# Patient Record
Sex: Female | Born: 1980 | Race: White | Hispanic: Yes | Marital: Married | Smoking: Never smoker
Health system: Southern US, Community
[De-identification: ages and names within clinical notes are randomized; demographics above are authoritative.]

## PROBLEM LIST (undated history)

## (undated) DIAGNOSIS — I10 Essential (primary) hypertension: Secondary | ICD-10-CM

## (undated) DIAGNOSIS — O0930 Supervision of pregnancy with insufficient antenatal care, unspecified trimester: Secondary | ICD-10-CM

## (undated) HISTORY — PX: LAPAROSCOPIC CHOLECYSTECTOMY: SUR755

## (undated) HISTORY — PX: ABDOMINAL SURGERY: SHX537

## (undated) HISTORY — DX: Essential (primary) hypertension: I10

---

## 1999-01-23 ENCOUNTER — Other Ambulatory Visit: Admission: RE | Admit: 1999-01-23 | Discharge: 1999-01-23 | Payer: Self-pay | Admitting: Obstetrics and Gynecology

## 1999-02-01 ENCOUNTER — Ambulatory Visit (HOSPITAL_COMMUNITY): Admission: RE | Admit: 1999-02-01 | Discharge: 1999-02-01 | Payer: Self-pay | Admitting: Obstetrics and Gynecology

## 1999-02-01 ENCOUNTER — Encounter: Payer: Self-pay | Admitting: Obstetrics and Gynecology

## 1999-02-26 ENCOUNTER — Ambulatory Visit (HOSPITAL_COMMUNITY): Admission: RE | Admit: 1999-02-26 | Discharge: 1999-02-26 | Payer: Self-pay | Admitting: *Deleted

## 1999-02-26 ENCOUNTER — Encounter: Payer: Self-pay | Admitting: *Deleted

## 1999-05-04 ENCOUNTER — Encounter (INDEPENDENT_AMBULATORY_CARE_PROVIDER_SITE_OTHER): Payer: Self-pay

## 1999-05-04 ENCOUNTER — Inpatient Hospital Stay (HOSPITAL_COMMUNITY): Admission: AD | Admit: 1999-05-04 | Discharge: 1999-05-07 | Payer: Self-pay | Admitting: *Deleted

## 2002-01-12 ENCOUNTER — Ambulatory Visit (HOSPITAL_COMMUNITY): Admission: RE | Admit: 2002-01-12 | Discharge: 2002-01-12 | Payer: Self-pay | Admitting: *Deleted

## 2002-03-23 ENCOUNTER — Observation Stay (HOSPITAL_COMMUNITY): Admission: AD | Admit: 2002-03-23 | Discharge: 2002-03-24 | Payer: Self-pay | Admitting: Obstetrics and Gynecology

## 2002-03-26 ENCOUNTER — Inpatient Hospital Stay (HOSPITAL_COMMUNITY): Admission: AD | Admit: 2002-03-26 | Discharge: 2002-03-28 | Payer: Self-pay | Admitting: *Deleted

## 2002-03-31 ENCOUNTER — Inpatient Hospital Stay (HOSPITAL_COMMUNITY): Admission: AD | Admit: 2002-03-31 | Discharge: 2002-03-31 | Payer: Self-pay | Admitting: *Deleted

## 2002-05-31 ENCOUNTER — Inpatient Hospital Stay (HOSPITAL_COMMUNITY): Admission: AD | Admit: 2002-05-31 | Discharge: 2002-06-02 | Payer: Self-pay | Admitting: *Deleted

## 2007-11-29 ENCOUNTER — Ambulatory Visit (HOSPITAL_COMMUNITY): Admission: RE | Admit: 2007-11-29 | Discharge: 2007-11-29 | Payer: Self-pay | Admitting: Obstetrics & Gynecology

## 2008-03-21 ENCOUNTER — Encounter: Payer: Self-pay | Admitting: Obstetrics & Gynecology

## 2008-03-21 ENCOUNTER — Ambulatory Visit: Payer: Self-pay | Admitting: Obstetrics and Gynecology

## 2008-03-21 ENCOUNTER — Inpatient Hospital Stay (HOSPITAL_COMMUNITY): Admission: AD | Admit: 2008-03-21 | Discharge: 2008-03-23 | Payer: Self-pay | Admitting: Obstetrics & Gynecology

## 2008-03-21 ENCOUNTER — Ambulatory Visit (HOSPITAL_COMMUNITY): Admission: RE | Admit: 2008-03-21 | Discharge: 2008-03-21 | Payer: Self-pay | Admitting: Obstetrics & Gynecology

## 2010-01-13 ENCOUNTER — Observation Stay (HOSPITAL_COMMUNITY): Admission: EM | Admit: 2010-01-13 | Discharge: 2010-01-14 | Payer: Self-pay | Admitting: Emergency Medicine

## 2010-01-13 ENCOUNTER — Encounter (INDEPENDENT_AMBULATORY_CARE_PROVIDER_SITE_OTHER): Payer: Self-pay

## 2010-12-03 LAB — URINALYSIS, ROUTINE W REFLEX MICROSCOPIC
Bilirubin Urine: NEGATIVE
Nitrite: NEGATIVE
Specific Gravity, Urine: 1.015 (ref 1.005–1.030)
pH: 8 (ref 5.0–8.0)

## 2010-12-03 LAB — DIFFERENTIAL
Basophils Absolute: 0 10*3/uL (ref 0.0–0.1)
Basophils Relative: 0 % (ref 0–1)
Eosinophils Absolute: 0.6 10*3/uL (ref 0.0–0.7)
Neutro Abs: 7.5 10*3/uL (ref 1.7–7.7)
Neutrophils Relative %: 64 % (ref 43–77)

## 2010-12-03 LAB — COMPREHENSIVE METABOLIC PANEL
ALT: 67 U/L — ABNORMAL HIGH (ref 0–35)
Alkaline Phosphatase: 98 U/L (ref 39–117)
BUN: 11 mg/dL (ref 6–23)
CO2: 27 mEq/L (ref 19–32)
Chloride: 104 mEq/L (ref 96–112)
Glucose, Bld: 112 mg/dL — ABNORMAL HIGH (ref 70–99)
Potassium: 4.3 mEq/L (ref 3.5–5.1)
Total Bilirubin: 0.3 mg/dL (ref 0.3–1.2)

## 2010-12-03 LAB — URINE MICROSCOPIC-ADD ON

## 2010-12-03 LAB — URINE CULTURE

## 2010-12-03 LAB — POCT PREGNANCY, URINE: Preg Test, Ur: NEGATIVE

## 2010-12-03 LAB — CBC
HCT: 39.9 % (ref 36.0–46.0)
Hemoglobin: 14.1 g/dL (ref 12.0–15.0)
RBC: 4.63 MIL/uL (ref 3.87–5.11)
WBC: 11.6 10*3/uL — ABNORMAL HIGH (ref 4.0–10.5)

## 2011-01-31 NOTE — Discharge Summary (Signed)
[facility]  Patient:    Taylor Lopez, Taylor Lopez Visit Number: [id number] MRN: [id number]          Service Type: OBS Location: [id number] Attending Physician:  Michaelle Copas Dictated by:   Dr. Francis Dowse Admit Date:  03/26/2002 Disc. Date: 03/24/02                             Discharge Summary  ADMISSION DIAGNOSES:          Preterm labor.  DISCHARGE DIAGNOSIS:          Resolution of preterm labor.  PROCEDURE:                    None.  HISTORY OF PRESENT ILLNESS:   This is a 30 year old, gravida 2, para 1-0-0-1, who presented at 28 weeks based on LMP in the MAU for contractions.  She had been seen at [facility] earlier on the date of admission at which time cervical changes were noted.  Per the patients report she had been found to be 1 cm dilated at [facility].  She denied any loss of fluid or vaginal bleeding.  She reported good fetal activity and lower abdominal cramping that began at 3 p.m. on the day of admission.  Per report, the patient was poorly hydrated.  Prenatal care began at [facility] at 12-6/7 weeks.  The patients previous pregnancy was associated with PIH, and induction of labor for PROM.  PRENATAL LABORATORY DATA:     All negative.  PHYSICAL EXAMINATION:         VITAL SIGNS: Afebrile with blood pressure of 132/69, pulse 99.  GENERAL: She was alert and in no acute distress.  PELVIC: Cervix fingertip, 60% effaced, and at -2 station.  Fetal heart rate tracing showed a baseline heart rate of 140s to 150s with good variability, reactivity, and some mild variable decellerations.  Tokometer showed uterine irritability.  A wet prep was done which showed many white blood cells and bacteria.  GC and Chlamydia and GBS tests were also sent.  HOSPITAL COURSE:              The patient was admitted for observation and started on Unasyn, fluids, and betamethasone and placed on bed rest.  During her hospital stay, the patient  remained stable.  Her contractions subsided ending at approximately 7 a.m. on the morning of discharge.  She denied any loss of fluid or vaginal bleeding and reported good fetal movement.  She remained afebrile with good blood pressures and fetal heart rate tracing showed heart rates in the 130s to 140s, good reactivity, good variability with occasional mild variable decellerations.  The tokometer showed no contractions or uterine irritability.  A cervical check showed no change in examination from admission with the patient at approximately 1 cm dilated, 60% effaced, -2 station with a soft cervix.  Because the patients contractions stopped, there was no loss of fluid and no cervical change since admission.  She was discharged to home with preterm labor instructions which were explained to her through an interpretor since the patient speaks Spanish only.  At the time of discharge, the patient had received one dose of betamethasone and the results of her GC, Chlamydia, and GBS tests were still pending.  The patient is to follow up in two to three days at the maternity admissions unit for a cervical check.  She was also encouraged  to keep her scheduled appointment at [facility].  CONDITION ON DISCHARGE:       Stable.  DISPOSITION:                  Discharged to home on bed rest.  DISCHARGE MEDICATIONS:        None.  DISCHARGE INSTRUCTIONS:       Regular diet, bed rest until reexamined by physician.  She was also instructed not to have sex until she was seen again by a physician.  She was to follow up at maternity admissions unit at [facility] [place] in two to three days for repeat cervical check. Dictated by:   Dr. Francis Dowse Attending Physician:  Michaelle Copas DD:  03/24/02 TD:  03/28/02 Job: [phone] [id number]

## 2011-01-31 NOTE — Discharge Summary (Signed)
[facility]  Patient:    Taylor Lopez, Taylor Lopez Visit Number: [id number] MRN: [id number]          Service Type: OBS Location: [facility] Attending Physician:  Enid Cutter Dictated by:    Dahlia Byes, M.D. Admit Date:  03/31/2002 Disc. Date: 03/28/02                             Discharge Summary  ADMISSION DIAGNOSIS:  Preterm contractions.  DISCHARGE DIAGNOSIS:  Preterm contractions without cervical change.  PROCEDURES:  None.  HISTORY OF PRESENT ILLNESS:  This is a 30 year old, G2, P1-0-0-1, who presented to the maternity admissions unit at 28-3/7 weeks by last menstrual period with contractions.  She reported approximately three contractions a hour.  No loss of fluid or vaginal bleeding, and good fetal activity.  The patient had been hospitalized at [facility] earlier in the week for preterm cervical changes, and had received Unasyn antibiotics and one dose of betamethasone.  She was discharged once her contractions had ceased, and had been asked to follow up at MAU on 03/26/02.  Since her discharge, she had not been bothered by contractions, but they returned on the night prior to this admission at approximately three contractions a hour.  ADMISSION MEDICATIONS: 1. Tylenol p.r.n. 2. Prenatal vitamins q.d.  PAST MEDICAL HISTORY:  History of pregnancy induced hypertension with her first pregnancy, which resulted in the delivery of a full-term female at 65 weeks, after induction of labor for premature rupture of membranes and pregnancy induced hypertension.  PHYSICAL EXAMINATION:  VITAL SIGNS:  Stable with a temperature of 99.1, blood pressure 138/64, pulse 86, respiratory rate 18.  GENERAL:  She was alert, in no acute distress with no right upper quadrant tenderness, no edema, and 1+ deep tendon reflexes.  PELVIC:  On speculum exam, some slight mucoid discharge was noted and a fetal fibronectin was obtained.  Cervical examination found the  cervix to be 1 cm dilated, 50% effaced, soft, and station of -2.  External fetal monitoring showed a baseline of 130s to 140s, good variability and reactivity, and a few mild variable decelerations.  Contractions were at a rate of every 3 to 5 minutes.  A wet prep was done which showed no yeast, Trichomonas, or clue cells, but was positive for rare white blood cells and moderate bacteria.  HOSPITAL COURSE:  The patient was given 0.25 mg of terbutaline subcutaneously x1, which decreased her contraction frequency.  She was admitted to the antenatal unit, placed on bedrest, continued with external fetal monitoring and tocometry.  She was given a 1 L IV fluid bolus.  The patient was given a second dose of subcutaneous terbutaline 0.25 mg, and then was started on 10 mg of Procardia p.o. q.6h. to help with contractions. By hospital day #2, her contractions had decreased to approximately 1 to 3 contractions a hour, and frequent uterine irritability.  She denied any vaginal bleeding or discharge, or loss of fluid, and had good fetal movement. A cervical examination showed the external os at 1 cm, the internal os closed, 60% effacement, and -2 station with a posterior cervix.  Fetal heart tracings showed heart rates in the 140s with good variability and no decelerations. The fetal fibronectin obtained on admission was reported as negative, and labs from 03/23/02, showed the patient to be GBS negative and GC and chlamydia negative.  The patient was continued on p.o. Procardia, bedrest, and Unasyn treatment.  On hospital day #3, the patient again reported no loss of fluid and good fetal movement.  Tocometry showed approximately one contraction per hour, and the external fetal monitoring showed heart tones in the 140s to 150s with good variability and accelerations without any decelerations.  Because the patients contractions were well controlled on p.o. Procardia, and because there were no cervical  changes since admission, she was deemed stable and discharged home for followup as an outpatient.  CONDITION ON DISCHARGE:  Stable.  DISPOSITION:  Discharged to home.  DISCHARGE MEDICATIONS:  Procardia 10 mg p.o. q.6h.  DISCHARGE INSTRUCTIONS: 1. Bedrest except for bathroom and eating privileges. 2. Nothing per vagina. 3. No sexual intercourse.  DIET:  Regular.  FOLLOWUP:  She is to return to the Maternity Admissions Unit at [facility] on Thursday, 03/31/02 for followup, and is to keep her scheduled appointment at [facility] on Wednesday, 04/06/02. Dictated byDahlia Byes, M.D. Attending Physician:  Enid Cutter DD:  03/28/02 TD:  03/31/02 Job: [id number]

## 2011-06-12 LAB — RPR: RPR Ser Ql: NONREACTIVE

## 2011-06-12 LAB — CBC
MCV: 87
Platelets: 209
RDW: 15.2
WBC: 8

## 2012-03-31 ENCOUNTER — Other Ambulatory Visit: Payer: Self-pay | Admitting: Family Medicine

## 2012-03-31 DIAGNOSIS — N644 Mastodynia: Secondary | ICD-10-CM

## 2012-04-29 ENCOUNTER — Ambulatory Visit
Admission: RE | Admit: 2012-04-29 | Discharge: 2012-04-29 | Disposition: A | Discharge status: Home / Self Care | Payer: Self-pay | Source: Ambulatory Visit | Attending: Family Medicine | Admitting: Family Medicine

## 2012-04-29 DIAGNOSIS — N644 Mastodynia: Secondary | ICD-10-CM

## 2012-04-29 NOTE — Progress Notes (Signed)
Interpreter Wyvonnia Dusky for mammogram

## 2014-12-17 ENCOUNTER — Emergency Department (HOSPITAL_COMMUNITY): Payer: No Typology Code available for payment source

## 2014-12-17 ENCOUNTER — Encounter (HOSPITAL_COMMUNITY): Payer: Self-pay | Admitting: Vascular Surgery

## 2014-12-17 ENCOUNTER — Emergency Department (HOSPITAL_COMMUNITY)
Admission: EM | Admit: 2014-12-17 | Discharge: 2014-12-17 | Disposition: A | Discharge status: Home / Self Care | Payer: No Typology Code available for payment source | Attending: Emergency Medicine | Admitting: Emergency Medicine

## 2014-12-17 DIAGNOSIS — S199XXA Unspecified injury of neck, initial encounter: Secondary | ICD-10-CM | POA: Diagnosis not present

## 2014-12-17 DIAGNOSIS — Y92410 Unspecified street and highway as the place of occurrence of the external cause: Secondary | ICD-10-CM | POA: Insufficient documentation

## 2014-12-17 DIAGNOSIS — S8991XA Unspecified injury of right lower leg, initial encounter: Secondary | ICD-10-CM | POA: Diagnosis present

## 2014-12-17 DIAGNOSIS — S8001XA Contusion of right knee, initial encounter: Secondary | ICD-10-CM | POA: Insufficient documentation

## 2014-12-17 DIAGNOSIS — Y998 Other external cause status: Secondary | ICD-10-CM | POA: Diagnosis not present

## 2014-12-17 DIAGNOSIS — M25561 Pain in right knee: Secondary | ICD-10-CM

## 2014-12-17 DIAGNOSIS — Y9389 Activity, other specified: Secondary | ICD-10-CM | POA: Insufficient documentation

## 2014-12-17 DIAGNOSIS — S4991XA Unspecified injury of right shoulder and upper arm, initial encounter: Secondary | ICD-10-CM | POA: Insufficient documentation

## 2014-12-17 MED ORDER — MORPHINE SULFATE 4 MG/ML IJ SOLN
4.0000 mg | Freq: Once | INTRAMUSCULAR | Status: AC
  Administered 2014-12-17: 4 mg via INTRAVENOUS
  Filled 2014-12-17: qty 1

## 2014-12-17 MED ORDER — IBUPROFEN 600 MG PO TABS: 600.0000 mg | ORAL_TABLET | Freq: Four times a day (QID) | ORAL | Status: DC | PRN

## 2014-12-17 MED ORDER — METHOCARBAMOL 500 MG PO TABS: 500.0000 mg | ORAL_TABLET | Freq: Two times a day (BID) | ORAL | Status: DC

## 2014-12-17 MED ORDER — OXYCODONE-ACETAMINOPHEN 5-325 MG PO TABS: 2.0000 | ORAL_TABLET | ORAL | Status: DC | PRN
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
## 2014-12-17 NOTE — ED Notes (Signed)
Patient returned from CT
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
## 2014-12-17 NOTE — Discharge Instructions (Signed)
Colisin con un vehculo de motor Academic librarian) Despus de sufrir un accidente automovilstico, es normal tener diversos hematomas y [employer]. Generalmente, estas molestias son peores durante las primeras 24 horas. En las primeras horas, probablemente sienta mayor entumecimiento y Engineer, mining. Tambin puede sentirse peor al despertarse la maana posterior a la colisin. A partir de all, debera comenzar a Associate Professor. La velocidad con que se mejora generalmente depende de la gravedad de la colisin y la cantidad, [place] y Firefighter de las lesiones. INSTRUCCIONES PARA EL CUIDADO EN EL HOGAR   Aplique hielo sobre la zona lesionada.  Ponga el hielo en una bolsa plstica.  Colquese una toalla entre la piel y la bolsa de hielo.  Deje el hielo durante 15 a , 3 a 4veces por da, o segn las indicaciones del mdico.  Albesa Seen suficiente lquido para mantener la orina clara o de color amarillo plido. No beba alcohol.  Tome una ducha o un bao tibio una o dos veces al da. Esto aumentar el flujo de [employer] msculos doloridos.  Puede retomar sus actividades normales cuando se lo indique el mdico. Tenga cuidado al levantar objetos, ya que puede agravar el dolor en el cuello o en la espalda.  Utilice los medicamentos de venta libre o recetados para Primary school teacher, el malestar o la fiebre, segn se lo indique el mdico. No tome aspirina. Puede aumentar los hematomas o la hemorragia. SOLICITE ATENCIN MDICA DE INMEDIATO SI:  Tiene entumecimiento, hormigueo o debilidad en los brazos o las piernas.  Tiene dolor de cabeza intenso que no mejora con medicamentos.  Siente un dolor intenso en el cuello, especialmente con la palpacin en el centro de la espalda o el cuello.  Disminuye su control de la vejiga o los intestinos.  Aumenta el dolor en cualquier parte del cuerpo.  Le falta el aire, tiene sensacin de desvanecimiento, mareos o [employer].  Siente  dolor en el pecho.  Tiene malestar estomacal (nuseas), vmitos o sudoracin.  Cada vez siente ms dolor abdominal.  Anola Gurney sangre en la orina, en la materia fecal o en el vmito.  Siente dolor en los hombros (en la zona del cinturn de seguridad).  Siente que los sntomas empeoran. ASEGRESE DE QUE:   Comprende estas instrucciones.  Controlar su afeccin.  Recibir ayuda de inmediato si no mejora o si empeora. Document Released: 06/11/2005 Document Revised: 01/16/2014 [facility] Patient Information 2015 [place], [place]. This information is not intended to replace advice given to you by your health care provider. Make sure you discuss any questions you have with your health care provider.  Emergency Department Resource Guide 1) Find a Doctor and Pay Out of Pocket Although you won't have to find out who is covered by your insurance plan, it is a good idea to ask around and get recommendations. You will then need to call the office and see if the doctor you have chosen will accept you as a new patient and what types of options they offer for patients who are self-pay. Some doctors offer discounts or will set up payment plans for their patients who do not have insurance, but you will need to ask so you aren't surprised when you get to your appointment.  2) Contact Your Local Health Department Not all health departments have doctors that can see patients for sick visits, but many do, so it is worth a call to see if yours does. If you don't know where your local health department is,  is, you can check in your phone book. The CDC also has a tool to help you locate your state's health department, and many state websites also have listings of all of their local health departments. ° °3) Find a Walk-in Clinic °If your illness is not likely to be very severe or complicated, you may want to try a walk in clinic. These are popping up all over the country in pharmacies, drugstores, and shopping centers.  They're usually staffed by nurse practitioners or physician assistants that have been trained to treat common illnesses and complaints. They're usually fairly quick and inexpensive. However, if you have serious medical issues or chronic medical problems, these are probably not your best option. ° °No Primary Care Doctor: °- Call Health Connect at  832-8000 - they can help you locate a primary care doctor that  accepts your insurance, provides certain services, etc. °- Physician Referral Service- 1-800-533-3463 ° °Chronic Pain Problems: °Organization         Address  Phone   Notes  °[facility]  (336) 297-2271 Patients need to be referred by their primary care doctor.  ° °Medication Assistance: °Organization         Address  Phone   Notes  °[place] [facility] [address] °[place], [place] [address] (336) 641-8030 --Must be a resident of [place] °-- Must have NO insurance coverage whatsoever (no Medicaid/ Medicare, etc.) °-- The pt. MUST have a primary care doctor that directs their care regularly and follows them in the community °  °[facility]  (866) 331-1348   °United Way  (888) 892-1162   ° °Agencies that provide inexpensive medical care: °Organization         Address  Phone   Notes  °[facility]  (336) 832-8035   °[facility]    (336) 832-7272   °[facility] [address] °[place], [place] [address] (336) 832-4777   °[facility] [address], °[place] (336) 271-4999   °[facility]    (336) 373-0678   °[facility]    (336) 272-1050   °[facility] ° [address], [place] Phone:  (336) 832-4444, Fax:  (336) 832-4440 Hours of Operation:  9 am - 6 pm, M-F.  Also accepts Medicaid/Medicare and self-pay.  ° [facility] ° [address], [place] Phone: (336) 832-3150, Fax: (336)  832-3151. Hours of Operation:  8:30 am - 5:30 pm, M-F.  Also accepts Medicaid and self-pay.  °[facility] [place] [address], [place] Phone: (336) 878-6027   °[facility] [address], [place],  (336)723-1848, Ext. 123 Mondays & Thursdays: 7-9 AM.  First 15 patients are seen on a first come, first serve basis. °  ° °Medicaid-accepting [place] Providers: ° °Organization         Address  Phone   Notes  °[facility] [address], [place] (336) 641-2100 Also accepts self-pay patients.  °[facility] [address], [place] ° (336) 856-9996   °[facility] [address], [place] (336) 288-8857   °[facility] [address] [place] [address], [place] (336) 299-7000   °Veita Bland [address], [place]  ° (336) 373-1557 Only accepts [place] Access Medicaid patients after they have their   name applied to their card.  ° °Self-Pay (no insurance) in [place]: ° °Organization         Address  Phone   Notes  °Sickle Cell Patients, [facility] [address], [place] (336) 832-1970   °[facility] [address], [place] (336) 832-4400   °[facility] ° [address], [place] (336) 992-4800   °[facility]/Dr. Osei-Bonsu ° [address] [place] [address], [place] or [address], [place] (336) 841-8500 Phone number for both [place] and [place] locations is the same.  °[facility] [address], [place] (336) 299-0000   °[facility] [address] [place] [address], [place] or [address] (336) 852-7530 °(336) 878-2260   °[facility] [address], [place] (336) 350-1642, phone; (336) 294-5005, fax Sees patients 1st and 3rd Saturday of every month.  Must not qualify for public or private insurance (i.e.  Medicaid, Medicare, [place] Health Choice, Veterans' Benefits) • Household income should be no more than 200% of the poverty level •The clinic cannot treat you if you are pregnant or think you are pregnant • Sexually transmitted diseases are not treated at the clinic.  ° ° °Dental Care: °Organization         Address  Phone  Notes  °[facility] [facility] [address], [place] (336) 641-6152 Accepts children up to age 21 who are enrolled in Medicaid or [place] Health Choice; pregnant women with a Medicaid card; and children who have applied for Medicaid or [place] Health Choice, but were declined, whose parents can pay a reduced fee at time of service.  °[facility] [place]  [address], [place] (336) 641-7733 Accepts children up to age 21 who are enrolled in Medicaid or [place] Health Choice; pregnant women with a Medicaid card; and children who have applied for Medicaid or [place] Health Choice, but were declined, whose parents can pay a reduced fee at time of service.  °[facility] ° [address], [place] (336) 641-4533 Patients are seen by appointment only. Walk-ins are not accepted. [facility] will see patients 18 years of age and older. °Monday - Tuesday (8am-5pm) °Most Wednesdays (8:30-5pm) °$30 per visit, cash only  °[facility] ° [address], [place] (336) 641-4533 Patients are seen by appointment only. Walk-ins are not accepted. [facility] will see patients 18 years of age and older. °One Wednesday Evening (Monthly: Volunteer Based).  $30 per visit, cash only  °[facility]  (919) 537-3737 for adults; Children under age 4, call Graduate Pediatric Dentistry at (919) [phone]. Children aged 4-14, please call (919) [phone] to request a pediatric application. ° Dental services are provided in all areas of dental care including fillings,  crowns and bridges, complete and partial dentures, implants, gum treatment, root canals, and extractions. Preventive care is also provided. Treatment is provided to both adults and children. °Patients are selected via a lottery and there is often a waiting list. °  °[facility] [address], °[place] ° (336) 763-8833 [email or web address] °  °[facility] [address], [place], [place] (336)723-1848, Ext. 123 Second and Fourth Thursday of each month, opens at 6:30 AM; Clinic ends at 9 AM.  Patients are seen on a   first-come first-served basis, and a limited number are seen during each clinic.  ° °[facility] ° [address], [place], [place] (336) 723-7904   Eligibility Requirements °You must have lived in [place], [place], or [place] counties for at least the last three months. °  You cannot be eligible for state or federal sponsored healthcare insurance, including Veterans Administration, Medicaid, or Medicare. °  You generally cannot be eligible for healthcare insurance through your employer.  °  How to apply: °Eligibility screenings are held every Tuesday and Wednesday afternoon from 1:00 pm until 4:00 pm. You do not need an appointment for the interview!  °[facility] [address], [place], [place] 336-631-2330   °[facility]  336-342-8273   °[facility]  336-703-3100   ° [facility]  336-570-6415   ° °Behavioral Health Resources in the Community: °Intensive Outpatient Programs °Organization         Address  Phone  Notes  °[facility] [address], [place], [place] 336-878-6098   °[facility] [address], [place], [place] 336-832-9800   °[facility] [address], [place], [place] ° 336-882-2125   °[facility] [address],  °[place], [place] 1-800-853-5163 or 336-641-4981   °Substance Abuse  Resources °Organization         Address  Phone  Notes  °[facility]  336-882-2125   °[facility]  336-784-9470   °[facility]  336-285-9073   °[facility]  336-845-3988   °Residential & Outpatient Substance Abuse Program  1-800-659-3381   °Psychological Services °Organization         Address  Phone  Notes  °[facility]  336- 832-9600   °[facility]  336- 378-7881   °[facility] [address], [place] 1-800-853-5163 or 336-641-4981   ° °Mobile Crisis Teams °Organization         Address  Phone  Notes  °[facility], Mobile Crisis Care Unit  1-877-626-1772   °Assertive °[facility] ° [address] [place], [place] 336-834-9664   °Sharon DeEsch [address] °[place] 336-554-5454   ° °Self-Help/Support Groups °Organization         Address  Phone             Notes  °[facility] - variety of support groups  336- 373-1402 Call for more information  °Narcotics Anonymous (NA), [facility] [address], °[place]  2 meetings at this location  ° °Residential Treatment Programs °Organization         Address  Phone  Notes  °[facility] [address],    °[place]  1-866-801-8205   °[facility] ° [address], [place], [place] 704-293-8524   °[facility] [address], [place] 336-845-3988 Admissions: 8am-3pm M-F  °[facility] [address]    °[place], [place] 336-841-1104   °[facility] [address], [place], [place] 336-379-7146   °[facility] [address]  °[place], [place] 336-285-9073   °[facility] Intensive Outpatient [address], [place], [place] 336-852-3033   °[facility] [address]  °[place],  1-877-615-2722 or 336-784-9470   °[facility] [address] [place],  336-227-7417   Accepts Medicaid  °[facility] [address]  °[place] 1-800-659-3381 Substance Abuse/Addiction Treatment  ° °[place] Behavioral Health Resources °Organization         Address  Phone  Notes  °[facility]  (888) 581-9988   °Julie Brannon, PhD [address] [place], [place]   (336) 349-5553 or (336) 951-0000   °[facility]   [address] °[place], [place] (336) 349-4454   °[facility] [address], [place], [place] (336) 342-8316 Insurance/Medicaid/sponsorship through [facility]  °[facility] [address]                                    [place], [place] (336) 342-8316 Therapy/tele-psych/case  °[facility] [address]  ° [place], [place] (336) 349-2233    °Dr. Arfeen  (336) 349-4544   °[facility] [place]  [employer] [place] [facility] 1) [address], [place] °2) [address], [place] °3)  [address], [place] (336) 349-3220 °(336) 342-7768 ° °(336) 342-8140   °[place] [facility] (336) 342-1394 or (336) 342-3537 (After Hours)    ° ° ° °

## 2014-12-17 NOTE — ED Notes (Addendum)
Patient transported to XR. 

## 2016-07-24 LAB — OB RESULTS CONSOLE HEPATITIS B SURFACE ANTIGEN: HEP B S AG: NEGATIVE

## 2016-07-24 LAB — OB RESULTS CONSOLE ANTIBODY SCREEN: ANTIBODY SCREEN: NEGATIVE

## 2016-07-24 LAB — OB RESULTS CONSOLE GC/CHLAMYDIA
Chlamydia: NEGATIVE
Gonorrhea: NEGATIVE

## 2016-07-24 LAB — OB RESULTS CONSOLE HIV ANTIBODY (ROUTINE TESTING): HIV: NONREACTIVE

## 2016-07-24 LAB — OB RESULTS CONSOLE RPR: RPR: NONREACTIVE

## 2016-07-24 LAB — OB RESULTS CONSOLE RUBELLA ANTIBODY, IGM: RUBELLA: IMMUNE

## 2016-09-15 NOTE — L&D Delivery Note (Signed)
Delivery Note At 7:17 AM a viable female was delivered via Vaginal, Spontaneous Delivery (Presentation: ROA).  APGAR: 9, 9; weight pending  .   Placenta status: Placenta delivered intact with gentle traction.  Cord: # vessels with the following complications: None. Cord pH: not collected  Anesthesia: No epidural  Episiotomy: None Lacerations: 1st degree Suture Repair: N/A Est. Blood Loss (mL): 150  Mom to postpartum.  Baby to Couplet care / Skin to Skin.  Taylor Lopez, PGY-1 01/14/2017, 8:08 AM  Patient is a [id number] at [redacted]w[redacted]d who was admitted w/ SROM/labor, uncomplicated prenatal course.  She progressed without augmentation.  I was gloved and present for delivery in its entirety.  Second stage of labor progressed, baby delivered after a couple contractions.  Mild early decels during second stage noted.  Complications: none  Lacerations: superficial 1st degree- hemostatic, not repaired  EBL: 150cc  Taylor Lopez, CNM 9:05 AM 01/14/2017

## 2017-01-05 LAB — OB RESULTS CONSOLE GBS: STREP GROUP B AG: NEGATIVE

## 2017-01-14 ENCOUNTER — Inpatient Hospital Stay (HOSPITAL_COMMUNITY)
Admission: AD | Admit: 2017-01-14 | Discharge: 2017-01-15 | DRG: 775 | Disposition: A | Discharge status: Home / Self Care | Payer: Medicaid Other | Source: Ambulatory Visit | Attending: Obstetrics and Gynecology | Admitting: Obstetrics and Gynecology

## 2017-01-14 ENCOUNTER — Encounter: Payer: Self-pay | Admitting: Student

## 2017-01-14 DIAGNOSIS — Z3493 Encounter for supervision of normal pregnancy, unspecified, third trimester: Secondary | ICD-10-CM | POA: Diagnosis present

## 2017-01-14 DIAGNOSIS — Z3A38 38 weeks gestation of pregnancy: Secondary | ICD-10-CM

## 2017-01-14 DIAGNOSIS — O4202 Full-term premature rupture of membranes, onset of labor within 24 hours of rupture: Secondary | ICD-10-CM | POA: Diagnosis present

## 2017-01-14 DIAGNOSIS — O4290 Premature rupture of membranes, unspecified as to length of time between rupture and onset of labor, unspecified weeks of gestation: Secondary | ICD-10-CM

## 2017-01-14 LAB — CBC
HCT: 40.4 % (ref 36.0–46.0)
HEMOGLOBIN: 14 g/dL (ref 12.0–15.0)
MCH: 29.5 pg (ref 26.0–34.0)
MCHC: 34.7 g/dL (ref 30.0–36.0)
MCV: 85.2 fL (ref 78.0–100.0)
PLATELETS: 235 10*3/uL (ref 150–400)
RBC: 4.74 MIL/uL (ref 3.87–5.11)
RDW: 15.2 % (ref 11.5–15.5)
WBC: 10.2 10*3/uL (ref 4.0–10.5)

## 2017-01-14 LAB — ABO/RH: ABO/RH(D): O POS

## 2017-01-14 LAB — TYPE AND SCREEN
ABO/RH(D): O POS
ANTIBODY SCREEN: NEGATIVE

## 2017-01-14 LAB — RPR: RPR Ser Ql: NONREACTIVE

## 2017-01-14 LAB — POCT FERN TEST: POCT FERN TEST: POSITIVE — AB

## 2017-01-14 MED ORDER — ONDANSETRON HCL 4 MG/2ML IJ SOLN: 4.0000 mg | INTRAMUSCULAR | Status: DC | PRN

## 2017-01-14 MED ORDER — FENTANYL CITRATE (PF) 100 MCG/2ML IJ SOLN: 100.0000 ug | INTRAMUSCULAR | Status: DC | PRN

## 2017-01-14 MED ORDER — LIDOCAINE HCL (PF) 1 % IJ SOLN
30.0000 mL | INTRAMUSCULAR | Status: DC | PRN
  Filled 2017-01-14: qty 30

## 2017-01-14 MED ORDER — FENTANYL 2.5 MCG/ML BUPIVACAINE 1/10 % EPIDURAL INFUSION (WH - ANES): 14.0000 mL/h | INTRAMUSCULAR | Status: DC | PRN

## 2017-01-14 MED ORDER — TETANUS-DIPHTH-ACELL PERTUSSIS 5-2.5-18.5 LF-MCG/0.5 IM SUSP: 0.5000 mL | Freq: Once | INTRAMUSCULAR | Status: DC

## 2017-01-14 MED ORDER — LIDOCAINE HCL (PF) 1 % IJ SOLN
INTRAMUSCULAR | Status: AC
  Filled 2017-01-14: qty 30

## 2017-01-14 MED ORDER — OXYCODONE-ACETAMINOPHEN 5-325 MG PO TABS: 2.0000 | ORAL_TABLET | ORAL | Status: DC | PRN

## 2017-01-14 MED ORDER — ACETAMINOPHEN 325 MG PO TABS
650.0000 mg | ORAL_TABLET | ORAL | Status: DC | PRN
  Administered 2017-01-14: 650 mg via ORAL
  Filled 2017-01-14: qty 2

## 2017-01-14 MED ORDER — SOD CITRATE-CITRIC ACID 500-334 MG/5ML PO SOLN: 30.0000 mL | ORAL | Status: DC | PRN

## 2017-01-14 MED ORDER — OXYTOCIN BOLUS FROM INFUSION
500.0000 mL | Freq: Once | INTRAVENOUS | Status: AC
  Administered 2017-01-14: 500 mL via INTRAVENOUS

## 2017-01-14 MED ORDER — OXYTOCIN 40 UNITS IN LACTATED RINGERS INFUSION - SIMPLE MED
INTRAVENOUS | Status: AC
  Filled 2017-01-14: qty 1000

## 2017-01-14 MED ORDER — ONDANSETRON HCL 4 MG/2ML IJ SOLN: 4.0000 mg | Freq: Four times a day (QID) | INTRAMUSCULAR | Status: DC | PRN

## 2017-01-14 MED ORDER — PHENYLEPHRINE 40 MCG/ML (10ML) SYRINGE FOR IV PUSH (FOR BLOOD PRESSURE SUPPORT)
80.0000 ug | PREFILLED_SYRINGE | INTRAVENOUS | Status: DC | PRN
  Filled 2017-01-14: qty 5

## 2017-01-14 MED ORDER — EPHEDRINE 5 MG/ML INJ
10.0000 mg | INTRAVENOUS | Status: DC | PRN
  Filled 2017-01-14: qty 2

## 2017-01-14 MED ORDER — BENZOCAINE-MENTHOL 20-0.5 % EX AERO
1.0000 "application " | INHALATION_SPRAY | CUTANEOUS | Status: DC | PRN
  Administered 2017-01-14: 1 via TOPICAL
  Filled 2017-01-14: qty 56

## 2017-01-14 MED ORDER — OXYTOCIN 40 UNITS IN LACTATED RINGERS INFUSION - SIMPLE MED: 2.5000 [IU]/h | INTRAVENOUS | Status: DC

## 2017-01-14 MED ORDER — PRENATAL MULTIVITAMIN CH
1.0000 | ORAL_TABLET | Freq: Every day | ORAL | Status: DC
  Administered 2017-01-15: 1 via ORAL
  Filled 2017-01-14: qty 1

## 2017-01-14 MED ORDER — ACETAMINOPHEN 325 MG PO TABS: 650.0000 mg | ORAL_TABLET | ORAL | Status: DC | PRN

## 2017-01-14 MED ORDER — DIPHENHYDRAMINE HCL 50 MG/ML IJ SOLN: 12.5000 mg | INTRAMUSCULAR | Status: DC | PRN

## 2017-01-14 MED ORDER — LACTATED RINGERS IV SOLN: 500.0000 mL | INTRAVENOUS | Status: DC | PRN

## 2017-01-14 MED ORDER — SENNOSIDES-DOCUSATE SODIUM 8.6-50 MG PO TABS
2.0000 | ORAL_TABLET | ORAL | Status: DC
  Administered 2017-01-15: 2 via ORAL
  Filled 2017-01-14: qty 2

## 2017-01-14 MED ORDER — COCONUT OIL OIL: 1.0000 "application " | TOPICAL_OIL | Status: DC | PRN

## 2017-01-14 MED ORDER — ZOLPIDEM TARTRATE 5 MG PO TABS: 5.0000 mg | ORAL_TABLET | Freq: Every evening | ORAL | Status: DC | PRN

## 2017-01-14 MED ORDER — FENTANYL 2.5 MCG/ML BUPIVACAINE 1/10 % EPIDURAL INFUSION (WH - ANES)
INTRAMUSCULAR | Status: DC
Start: 2017-01-14 — End: 2017-01-14
  Filled 2017-01-14: qty 100

## 2017-01-14 MED ORDER — LACTATED RINGERS IV SOLN: 500.0000 mL | Freq: Once | INTRAVENOUS | Status: DC

## 2017-01-14 MED ORDER — OXYCODONE-ACETAMINOPHEN 5-325 MG PO TABS: 1.0000 | ORAL_TABLET | ORAL | Status: DC | PRN

## 2017-01-14 MED ORDER — DIBUCAINE 1 % RE OINT: 1.0000 "application " | TOPICAL_OINTMENT | RECTAL | Status: DC | PRN

## 2017-01-14 MED ORDER — ONDANSETRON HCL 4 MG PO TABS: 4.0000 mg | ORAL_TABLET | ORAL | Status: DC | PRN

## 2017-01-14 MED ORDER — WITCH HAZEL-GLYCERIN EX PADS: 1.0000 "application " | MEDICATED_PAD | CUTANEOUS | Status: DC | PRN

## 2017-01-14 MED ORDER — SIMETHICONE 80 MG PO CHEW: 80.0000 mg | CHEWABLE_TABLET | ORAL | Status: DC | PRN

## 2017-01-14 MED ORDER — PHENYLEPHRINE 40 MCG/ML (10ML) SYRINGE FOR IV PUSH (FOR BLOOD PRESSURE SUPPORT)
PREFILLED_SYRINGE | INTRAVENOUS | Status: AC
  Filled 2017-01-14: qty 20

## 2017-01-14 MED ORDER — LACTATED RINGERS IV SOLN
INTRAVENOUS | Status: DC
  Administered 2017-01-14: 05:00:00 via INTRAVENOUS

## 2017-01-14 MED ORDER — IBUPROFEN 600 MG PO TABS
600.0000 mg | ORAL_TABLET | Freq: Four times a day (QID) | ORAL | Status: DC
  Administered 2017-01-14 – 2017-01-15 (×5): 600 mg via ORAL
  Filled 2017-01-14 (×5): qty 1

## 2017-01-14 MED ORDER — DIPHENHYDRAMINE HCL 25 MG PO CAPS: 25.0000 mg | ORAL_CAPSULE | Freq: Four times a day (QID) | ORAL | Status: DC | PRN

## 2017-01-14 NOTE — MAU Note (Signed)
Patient reports contractions 3-4 mins apart.  Reports good FM.  Noticed some bleeding starting at 2000 after using the restroom.  No HSV/MRSA.

## 2017-01-14 NOTE — Lactation Note (Signed)
This note was copied from a baby's chart. Lactation Consultation Note  Patient Name: Taylor Lopez Date: 01/14/2017 Reason for consult: Initial assessment  Initial visit at 14 hours of age with Spanish interpreter, [place].  Mom reports baby latched at 2115 about 25 minutes ago.  LC observed for swallows and baby released nipple.  Mom re-latched baby in cradle hold and denies pain with latching at breast, but does report uterine cramping. LC provided pillow support and discussed alternate position holds.  Baby was stimulated to continue sucking for a few more minutes, no audible swallows at this time.  When baby released the nipple top half noted to be compressed with blanching of tip.   LC assisted with hand expression for a few minutes and alternated between breasts, No colostrum expressed at this time.  MBU RN at bedside reports unable to express with previous attempts.  LC encouraged mom to continue hand expression attempts.         [employer] LC resources given and discussed.  Encouraged to feed with early cues on demand.  Early newborn behavior discussed.  Mom to call for assist as needed.     Maternal Data Has patient been taught Hand Expression?: Yes Does the patient have breastfeeding experience prior to this delivery?: Yes  Feeding Feeding Type: Breast Fed  LATCH Score/Interventions Latch: Grasps breast easily, tongue down, lips flanged, rhythmical sucking. Intervention(s): Waking techniques  Audible Swallowing: None  Type of Nipple: Everted at rest and after stimulation  Comfort (Breast/Nipple): Soft / non-tender     Hold (Positioning): Assistance needed to correctly position infant at breast and maintain latch. Intervention(s): Breastfeeding basics reviewed;Support Pillows;Position options;Skin to skin  LATCH Score: 7  Lactation Tools Discussed/Used WIC Program: Yes   Consult Status Consult Status: Follow-up Date: 01/15/17 Follow-up type:  In-patient    Proctor Carriker, Arvella Merles 01/14/2017, 10:06 PM

## 2017-01-14 NOTE — MAU Provider Note (Signed)
History   [id number]   Chief Complaint  Patient presents with  . Contractions    HPI Taylor Lopez is a 36 y.o. female  G4P3 here with complaint of contractions. I was asked to see patient by RN due to concern for SROM. Patient reports come blood when wiping but hasn't noted gush of fluid. Denies complications with this pregnancy. Positive fetal movement.  Spanish interpreter vis phone.   No LMP recorded. Patient is pregnant.  OB History  Gravida Para Term Preterm AB Living  4 3          SAB TAB Ectopic Multiple Live Births               # Outcome Date GA Lbr Len/2nd Weight Sex Delivery Anes PTL Lv  4 Current           3 Para      Vag-Spont     2 Para      Vag-Spont     1 Para      Vag-Spont         No past medical history on file.  No family history on file.  Social History   Social History  . Marital status: Single    Spouse name: N/A  . Number of children: N/A  . Years of education: N/A   Social History Main Topics  . Smoking status: Never Smoker  . Smokeless tobacco: Never Used  . Alcohol use Yes     Comment: occasionally  . Drug use: No  . Sexual activity: Not on file   Other Topics Concern  . Not on file   Social History Narrative  . No narrative on file    Not on File  No current facility-administered medications on file prior to encounter.    Current Outpatient Prescriptions on File Prior to Encounter  Medication Sig Dispense Refill  . ibuprofen (ADVIL,MOTRIN) 600 MG tablet Take 1 tablet (600 mg total) by mouth every 6 (six) hours as needed. 30 tablet 0  . methocarbamol (ROBAXIN) 500 MG tablet Take 1 tablet (500 mg total) by mouth 2 (two) times daily. 20 tablet 0  . oxyCODONE-acetaminophen (PERCOCET/ROXICET) 5-325 MG per tablet Take 2 tablets by mouth every 4 (four) hours as needed for severe pain. 10 tablet 0     Review of Systems  Gastrointestinal: Positive for abdominal pain.  Genitourinary: Positive for vaginal bleeding and vaginal  discharge.   Physical Exam   Vitals:   01/14/17 0340 01/14/17 0347 01/14/17 0401 01/14/17 0417  BP: (!) 150/87 (!) 144/76 140/75 139/80  Pulse: (!) 103 98 98 (!) 102  Resp: (!) 21     Temp: 98.4 F (36.9 C)     TempSrc: Oral       Physical Exam  Nursing note and vitals reviewed. Constitutional: She is oriented to person, place, and time. She appears well-developed and well-nourished. No distress.  HENT:  Head: Normocephalic and atraumatic.  Eyes: Conjunctivae are normal. Right eye exhibits no discharge. Left eye exhibits no discharge. No scleral icterus.  Neck: Normal range of motion.  Respiratory: Effort normal. No respiratory distress.  GI: Soft.  Ctx palpate moderate; soft between contractions  Genitourinary:  Genitourinary Comments: + pooling; moderate amount of blood tinged watery fluid  Neurological: She is alert and oriented to person, place, and time.  Skin: Skin is warm and dry. She is not diaphoretic.  Psychiatric: She has a normal mood and affect. Her behavior is normal. Judgment and thought content  normal.   Dilation: 1 Effacement (%): 70, 80 Station: -2 Presentation: Vertex Exam by:: Danella Deis, RN  Fetal Tracing:  Baseline: 140 Variability: moderate Accelerations: 10x10 Decelerations: variable & early  Toco: Q2-7 mins MAU Course  Procedures Results for orders placed or performed during the hospital encounter of 01/14/17 (from the past 24 hour(s))  POCT fern test     Status: Abnormal   Collection Time: 01/14/17  4:27 AM  Result Value Ref Range   POCT Fern Test Positive = ruptured amniotic membanes (A)     MDM + pooling & + fern  Assessment and Plan  A; 1. Amniotic fluid leaking    P; Rn to call for admission   Judeth Horn, NP 01/14/2017 4:12 AM

## 2017-01-14 NOTE — H&P (Signed)
Taylor Lopez is a 36 y.o. female [phone] @ 38.3wks presenting for leaking pinking fluid since 2000 last evening. Reports min ctx. Denies  H/A, N/V or visual disturbances. Her preg has been followed by the [facility] and has been remarkable for 1) AMA 2) abnl glucola but passed GTT 3) GBS neg  OB History    Gravida Para Term Preterm AB Living   4 3           SAB TAB Ectopic Multiple Live Births                 History reviewed. No pertinent past medical history. Past Surgical History:  Procedure Laterality Date  . ABDOMINAL SURGERY     Family History: family history is not on file. Social History:  reports that she has never smoked. She has never used smokeless tobacco. She reports that she drinks alcohol. She reports that she does not use drugs.     Maternal Diabetes: No Genetic Screening: Normal Maternal Ultrasounds/Referrals: Normal Fetal Ultrasounds or other Referrals:  None Maternal Substance Abuse:  No Significant Maternal Medications:  None Significant Maternal Lab Results:  Lab values include: Group B Strep negative Other Comments:  None  ROS History Dilation: 10 Effacement (%): 100 Station: +3 Exam by:: amwlker,rn Blood pressure (!) 127/58, pulse 92, temperature 97.8 F (36.6 C), temperature source Oral, resp. rate (!) 22, height  (1.549 m), weight 93 kg (205 lb), SpO2 98 %. Exam Physical Exam  Constitutional: She is oriented to person, place, and time. She appears well-developed.  HENT:  Head: Normocephalic.  Neck: Normal range of motion.  Cardiovascular: Normal rate.   Respiratory: Effort normal.  GI:  EFM 130s, +accels, early variables Ctx q 2-3 mins  Musculoskeletal: Normal range of motion.  Neurological: She is alert and oriented to person, place, and time.  Skin: Skin is warm and dry.  Psychiatric: She has a normal mood and affect. Her behavior is normal. Thought content normal.    Prenatal labs: ABO, Rh: --/--/O POS (05/02 0450) Antibody: NEG  (05/02 0450) Rubella: Immune (11/09 0000) RPR: Nonreactive (11/09 0000)  HBsAg: Negative (11/09 0000)  HIV: Non-reactive (11/09 0000)  GBS: Negative (04/23 0000)   Assessment/Plan: IUP@38 .3wks SROM Latent labor initially, now appears to be active  Admit to [facility] Expectant management Anticipate SVD   Cam Hai CNM 01/14/2017, 7:31 AM

## 2017-01-15 MED ORDER — NORETHINDRONE 0.35 MG PO TABS: 1.0000 | ORAL_TABLET | Freq: Every day | ORAL | 11 refills | Status: DC

## 2017-01-15 MED ORDER — IBUPROFEN 600 MG PO TABS: 600.0000 mg | ORAL_TABLET | Freq: Four times a day (QID) | ORAL | 0 refills | Status: DC

## 2017-01-15 NOTE — Discharge Instructions (Signed)

## 2017-01-15 NOTE — Discharge Summary (Signed)
OB Discharge Summary  Patient Name: Taylor Lopez DOB: Apr 27, 1981 MRN: [id number]  Date of admission: 01/14/2017 Delivering MD: Lovena Neighbours   Date of discharge: 01/15/2017  Admitting diagnosis: 38 WEEKS CTX Intrauterine pregnancy: [redacted]w[redacted]d     Secondary diagnosis:Active Problems:   Amniotic fluid leaking  Additional problems: AMA     Discharge diagnosis: Term Pregnancy Delivered        Complications: None  Hospital course:  Onset of Labor With Vaginal Delivery     36 y.o. yo G4P1001 at [redacted]w[redacted]d was admitted in Active Labor on 01/14/2017. Patient had an uncomplicated labor course as follows:  Membrane Rupture Time/Date: 8:00 PM ,01/13/2017   Intrapartum Procedures: Episiotomy: None [1]                                         Lacerations:  1st degree [2]  Patient had a delivery of a Viable infant. 01/14/2017  Information for the patient's newborn:  Kandy Garrison Girl Nita [[id number]]  Delivery Method: Vag-Spont    Pateint had an uncomplicated postpartum course.  She is ambulating, tolerating a regular diet, passing flatus, and urinating well. Patient is discharged home in stable condition on 01/15/17.   Physical exam  Vitals:   01/14/17 1035 01/14/17 1327 01/14/17 1900 01/15/17 0550  BP: 135/69 121/68 140/77 128/65  Pulse: 97 88 86 73  Resp: 18 20 20 18   Temp: 98.4 F (36.9 C) 98.5 F (36.9 C) 98.5 F (36.9 C) 98.3 F (36.8 C)  TempSrc: Oral Oral Oral Oral  SpO2:   99%   Weight:      Height:       General: alert Lochia: appropriate Uterine Fundus: firm Incision: N/A DVT Evaluation: No evidence of DVT seen on physical exam. Labs: Lab Results  Component Value Date   WBC 10.2 01/14/2017   HGB 14.0 01/14/2017   HCT 40.4 01/14/2017   MCV 85.2 01/14/2017   PLT 235 01/14/2017   CMP Latest Ref Rng & Units 01/13/2010  Glucose 70 - 99 mg/dL 782(N)  BUN 6 - 23 mg/dL 11  Creatinine 0.4 - 1.2 mg/dL 5.62  Sodium 130 - 865 mEq/L 138  Potassium 3.5 - 5.1 mEq/L 4.3   Chloride 96 - 112 mEq/L 104  CO2 19 - 32 mEq/L 27  Calcium 8.4 - 10.5 mg/dL 9.3  Total Protein 6.0 - 8.3 g/dL 7.2  Total Bilirubin 0.3 - 1.2 mg/dL 0.3  Alkaline Phos 39 - 117 U/L 98  AST 0 - 37 U/L 33  ALT 0 - 35 U/L 67(H)    Discharge instruction: per After Visit Summary and "Baby and Me Booklet".  After Visit Meds:  Allergies as of 01/15/2017   No Known Allergies     Medication List    TAKE these medications   ibuprofen 600 MG tablet Commonly known as:  ADVIL,MOTRIN Take 1 tablet (600 mg total) by mouth every 6 (six) hours.   prenatal multivitamin Tabs tablet Take 1 tablet by mouth daily at 12 noon.       Diet: routine diet  Activity: Advance as tolerated. Pelvic rest for 6 weeks.   Outpatient follow up:6 weeks Follow up Appt:No future appointments. Follow up visit: No Follow-up on file.  Postpartum contraception: Progesterone only pills  Newborn Data: Live born female  Birth Weight: 7 lb 9.3 oz (3440 g) APGAR: 9, 9  Baby Feeding:  Breast Disposition:home with mother   01/15/2017 Allie BossierMyra C [place], MD

## 2019-06-09 ENCOUNTER — Encounter: Payer: Self-pay | Admitting: Obstetrics and Gynecology

## 2019-06-09 ENCOUNTER — Other Ambulatory Visit: Payer: Self-pay

## 2019-06-09 ENCOUNTER — Other Ambulatory Visit (HOSPITAL_COMMUNITY): Admission: RE | Admit: 2019-06-09 | Payer: Self-pay | Source: Ambulatory Visit | Admitting: Obstetrics and Gynecology

## 2019-06-09 ENCOUNTER — Ambulatory Visit (INDEPENDENT_AMBULATORY_CARE_PROVIDER_SITE_OTHER): Payer: Self-pay | Admitting: Obstetrics and Gynecology

## 2019-06-09 VITALS — BP 124/77 | HR 93 | Wt 174.6 lb

## 2019-06-09 DIAGNOSIS — O139 Gestational [pregnancy-induced] hypertension without significant proteinuria, unspecified trimester: Secondary | ICD-10-CM

## 2019-06-09 DIAGNOSIS — Z23 Encounter for immunization: Secondary | ICD-10-CM

## 2019-06-09 DIAGNOSIS — O09522 Supervision of elderly multigravida, second trimester: Secondary | ICD-10-CM

## 2019-06-09 DIAGNOSIS — Z113 Encounter for screening for infections with a predominantly sexual mode of transmission: Secondary | ICD-10-CM

## 2019-06-09 DIAGNOSIS — Z603 Acculturation difficulty: Secondary | ICD-10-CM

## 2019-06-09 DIAGNOSIS — O0990 Supervision of high risk pregnancy, unspecified, unspecified trimester: Secondary | ICD-10-CM

## 2019-06-09 DIAGNOSIS — Z3A18 18 weeks gestation of pregnancy: Secondary | ICD-10-CM

## 2019-06-09 DIAGNOSIS — Z124 Encounter for screening for malignant neoplasm of cervix: Secondary | ICD-10-CM

## 2019-06-09 DIAGNOSIS — O0992 Supervision of high risk pregnancy, unspecified, second trimester: Secondary | ICD-10-CM

## 2019-06-09 DIAGNOSIS — O09529 Supervision of elderly multigravida, unspecified trimester: Secondary | ICD-10-CM

## 2019-06-09 DIAGNOSIS — Z789 Other specified health status: Secondary | ICD-10-CM

## 2019-06-09 DIAGNOSIS — O132 Gestational [pregnancy-induced] hypertension without significant proteinuria, second trimester: Secondary | ICD-10-CM

## 2019-06-09 DIAGNOSIS — Z1151 Encounter for screening for human papillomavirus (HPV): Secondary | ICD-10-CM

## 2019-06-09 MED ORDER — ASPIRIN EC 81 MG PO TBEC: 81.0000 mg | DELAYED_RELEASE_TABLET | Freq: Every day | ORAL | 2 refills | Status: DC

## 2019-06-09 NOTE — Progress Notes (Signed)
New OB Note  06/09/2019   Clinic: [facility]  Chief Complaint: NOB   Transfer of Care Patient: no  History of Present Illness: [name] is a 38 y.o. G5P1004 @ 18/0 weeks (EDC 2/25 [tentative], based on in office u/s today.  No LMP recorded (lmp unknown). Patient is pregnant.).  Preg complicated by has Antepartum transient hypertension; Antepartum multigravida of advanced maternal age; Supervision of high risk pregnancy, antepartum; and Language barrier on their problem list.   Any events prior to today's visit: no Her periods were: unknown She was using no method when she conceived.  She has Negative signs or symptoms of nausea/vomiting of pregnancy. She has Negative signs or symptoms of miscarriage or preterm labor On any medications around the time she conceived/early pregnancy: No   ROS: A 12-point review of systems was performed and negative, except as stated in the above HPI.  OBGYN History: As per HPI. OB History  Gravida Para Term Preterm AB Living  5 4 1     4   SAB TAB Ectopic Multiple Live Births        0 4    # Outcome Date GA Lbr Len/2nd Weight Sex Delivery Anes PTL Lv  5 Current           4 Term 01/14/17 [redacted]w[redacted]d 11:13 / 00:04 7 lb 9.3 oz (3.44 kg) F Vag-Spont None  LIV  3 Para 03/21/08     Vag-Spont     2 Para 05/28/02     Vag-Spont     1 Para 05/05/99     Vag-Spont       Any issues with any prior pregnancies: no Prior children are healthy, doing well, and without any problems or issues: yes History of pap smears: Yes. Last pap smear 2017 and results were negative   Past Medical History: No past medical history on file.  Past Surgical History: Past Surgical History:  Procedure Laterality Date  . LAPAROSCOPIC CHOLECYSTECTOMY      Family History:  No family history on file.   Social History:  Social History   Socioeconomic History  . Marital status: Single    Spouse name: Not on file  . Number of children: Not on  file  . Years of education: Not on file  . Highest education level: Not on file  Occupational History  . Not on file  Social Needs  . Financial resource strain: Not on file  . Food insecurity    Worry: Not on file    Inability: Not on file  . Transportation needs    Medical: Not on file    Non-medical: Not on file  Tobacco Use  . Smoking status: Never Smoker  . Smokeless tobacco: Never Used  Substance and Sexual Activity  . Alcohol use: Yes    Comment: occasionally  . Drug use: No  . Sexual activity: Yes    Birth control/protection: None  Lifestyle  . Physical activity    Days per week: Not on file    Minutes per session: Not on file  . Stress: Not on file  Relationships  . Social 2018 on phone: Not on file    Gets together: Not on file    Attends religious service: Not on file    Active member of club or organization: Not on file    Attends meetings of clubs or organizations: Not on file    Relationship status: Not on file  . Intimate  partner violence    Fear of current or ex partner: Not on file    Emotionally abused: Not on file    Physically abused: Not on file    Forced sexual activity: Not on file  Other Topics Concern  . Not on file  Social History Narrative  . Not on file    Allergy: No Known Allergies   Current Outpatient Medications: PNV  Physical Exam:   BP 124/77   Pulse 93   Wt 174 lb 9.6 oz (79.2 kg)   LMP  (LMP Unknown)   BMI 32.99 kg/m  Body mass index is 32.99 kg/m. Contractions: Not present Vag. Bleeding: None. Fundal height: 18 FHTs: 140s  General appearance: Well nourished, well developed female in no acute distress.  Neck:  Supple, normal appearance, and no thyromegaly  Cardiovascular: S1, S2 normal, no murmur, rub or gallop, regular rate and rhythm Respiratory:  Clear to auscultation bilateral. Normal respiratory effort Abdomen: positive bowel sounds and no masses, hernias; diffusely non tender to palpation,  non distended Breasts: breasts appear normal, no suspicious masses, no skin or nipple changes or axillary nodes, and normal palpation. Neuro/Psych:  Normal mood and affect.  Skin:  Warm and dry.  Lymphatic:  No inguinal lymphadenopathy.   Pelvic exam: is not limited by body habitus EGBUS: within normal limits, Vagina: within normal limits and with no blood in the vault, Cervix: normal appearing cervix without discharge or lesions, closed/long/high, Uterus:  enlarged, c/w 18 week size, and Adnexa:  normal adnexa and no mass, fullness, tenderness  Laboratory: none  Imaging:  Bedside u/s per RN: SLIUP, FL 18wks, FHR 140s, subj normal AF  Assessment: pt doing well  Plan: 1. Supervision of high risk pregnancy, antepartum Routine care. Declines cffdna due to potential cost. Okay with afp tetra. Will set up for [place] u/s in 1-2wks. Pt okay with starting low dose asa.  - Obstetric Panel, Including HIV - Comprehensive metabolic panel - TSH - Protein / creatinine ratio, urine - Culture, OB Urine - Cytology - PAP( [place]) - Hemoglobin A1c - AFP TETRA  2. Antepartum multigravida of advanced maternal age Pt declines cffdna due to potential costs - AFP TETRA  3. Antepartum transient hypertension  4. Language barrier Interpreter used  Problem list reviewed and updated.  Follow up in 4 weeks.  The nature of [facility] with multiple MDs and other Advanced Practice Providers was explained to patient; also emphasized that residents, students are part of our team.  >50% of 25 min visit spent on counseling and coordination of care.     Durene Romans MD Attending Center [facility])

## 2019-06-09 NOTE — Progress Notes (Signed)
Pt. States she went to clinic for infection. No pap on file   Pt. Wants flu shot  Pt. Wants to try baby scripts, if Pt. Doesn't like it, would rather continue in office visits.

## 2019-06-11 LAB — URINE CULTURE, OB REFLEX

## 2019-06-11 LAB — CULTURE, OB URINE

## 2019-06-12 LAB — COMPREHENSIVE METABOLIC PANEL
ALT: 11 IU/L (ref 0–32)
AST: 9 IU/L (ref 0–40)
Albumin/Globulin Ratio: 1.3 (ref 1.2–2.2)
Albumin: 3.5 g/dL — ABNORMAL LOW (ref 3.8–4.8)
Alkaline Phosphatase: 77 IU/L (ref 39–117)
BUN/Creatinine Ratio: 15 (ref 9–23)
BUN: 7 mg/dL (ref 6–20)
Bilirubin Total: 0.2 mg/dL (ref 0.0–1.2)
CO2: 21 mmol/L (ref 20–29)
Calcium: 9.2 mg/dL (ref 8.7–10.2)
Chloride: 104 mmol/L (ref 96–106)
Creatinine, Ser: 0.48 mg/dL — ABNORMAL LOW (ref 0.57–1.00)
GFR calc Af Amer: 143 mL/min/{1.73_m2} (ref >59)
GFR calc non Af Amer: 124 mL/min/{1.73_m2} (ref >59)
Globulin, Total: 2.6 g/dL (ref 1.5–4.5)
Glucose: 99 mg/dL (ref 65–99)
Potassium: 4 mmol/L (ref 3.5–5.2)
Sodium: 137 mmol/L (ref 134–144)
Total Protein: 6.1 g/dL (ref 6.0–8.5)

## 2019-06-12 LAB — AFP TETRA
DIA Mom Value: 0.65
DIA Value (EIA): 98.3 pg/mL
DSR (By Age)    1 IN: 95
DSR (Second Trimester) 1 IN: 693
Gestational Age: 18 WEEKS
MSAFP Mom: 0.74
MSAFP: 29.3 ng/mL
MSHCG Mom: 1.63
MSHCG: 41988 m[IU]/mL
Maternal Age At EDD: 39.7 yr
Osb Risk: 10000
T18 (By Age): 1:370 {titer}
Test Results:: NEGATIVE
Weight: 174 [lb_av]
uE3 Mom: 0.93
uE3 Value: 1.32 ng/mL

## 2019-06-12 LAB — HEMOGLOBIN A1C
Est. average glucose Bld gHb Est-mCnc: 108 mg/dL
Hgb A1c MFr Bld: 5.4 % (ref 4.8–5.6)

## 2019-06-12 LAB — OBSTETRIC PANEL, INCLUDING HIV
Antibody Screen: NEGATIVE
Basophils Absolute: 0 10*3/uL (ref 0.0–0.2)
Basos: 0 %
EOS (ABSOLUTE): 0.1 10*3/uL (ref 0.0–0.4)
Eos: 1 %
HIV Screen 4th Generation wRfx: NONREACTIVE
Hematocrit: 39.5 % (ref 34.0–46.6)
Hemoglobin: 12.4 g/dL (ref 11.1–15.9)
Hepatitis B Surface Ag: NEGATIVE
Immature Grans (Abs): 0.1 10*3/uL (ref 0.0–0.1)
Immature Granulocytes: 1 %
Lymphocytes Absolute: 1.4 10*3/uL (ref 0.7–3.1)
Lymphs: 17 %
MCH: 27.8 pg (ref 26.6–33.0)
MCHC: 31.4 g/dL — ABNORMAL LOW (ref 31.5–35.7)
MCV: 89 fL (ref 79–97)
Monocytes Absolute: 0.6 10*3/uL (ref 0.1–0.9)
Monocytes: 7 %
Neutrophils Absolute: 6.3 10*3/uL (ref 1.4–7.0)
Neutrophils: 74 %
Platelets: 255 10*3/uL (ref 150–450)
RBC: 4.46 x10E6/uL (ref 3.77–5.28)
RDW: 13.4 % (ref 11.7–15.4)
RPR Ser Ql: NONREACTIVE
Rh Factor: POSITIVE
Rubella Antibodies, IGG: >33 index (ref >0.99)
WBC: 8.5 10*3/uL (ref 3.4–10.8)

## 2019-06-12 LAB — TSH: TSH: 1.83 u[IU]/mL (ref 0.450–4.500)

## 2019-06-12 LAB — PROTEIN / CREATININE RATIO, URINE
Creatinine, Urine: 66.8 mg/dL
Protein, Ur: 5.5 mg/dL
Protein/Creat Ratio: 82 mg/g creat (ref 0–200)

## 2019-06-13 LAB — CYTOLOGY - PAP
Adequacy: ABSENT
Chlamydia: NEGATIVE
Diagnosis: NEGATIVE
High risk HPV: NEGATIVE
Molecular Disclaimer: 56
Molecular Disclaimer: NEGATIVE
Molecular Disclaimer: NEGATIVE
Molecular Disclaimer: NORMAL
Molecular Disclaimer: NORMAL
Neisseria Gonorrhea: NEGATIVE
Trichomonas: NEGATIVE

## 2019-06-22 ENCOUNTER — Encounter: Payer: Self-pay | Admitting: Radiology

## 2019-07-07 ENCOUNTER — Ambulatory Visit (INDEPENDENT_AMBULATORY_CARE_PROVIDER_SITE_OTHER): Payer: Self-pay | Admitting: Obstetrics and Gynecology

## 2019-07-07 ENCOUNTER — Other Ambulatory Visit: Payer: Self-pay

## 2019-07-07 VITALS — BP 123/72 | HR 99 | Wt 182.0 lb

## 2019-07-07 DIAGNOSIS — O09522 Supervision of elderly multigravida, second trimester: Secondary | ICD-10-CM

## 2019-07-07 DIAGNOSIS — O0992 Supervision of high risk pregnancy, unspecified, second trimester: Secondary | ICD-10-CM

## 2019-07-07 DIAGNOSIS — O09529 Supervision of elderly multigravida, unspecified trimester: Secondary | ICD-10-CM

## 2019-07-07 DIAGNOSIS — O4402 Placenta previa specified as without hemorrhage, second trimester: Secondary | ICD-10-CM | POA: Insufficient documentation

## 2019-07-07 DIAGNOSIS — Z789 Other specified health status: Secondary | ICD-10-CM

## 2019-07-07 DIAGNOSIS — O365920 Maternal care for other known or suspected poor fetal growth, second trimester, not applicable or unspecified: Secondary | ICD-10-CM

## 2019-07-07 DIAGNOSIS — O365990 Maternal care for other known or suspected poor fetal growth, unspecified trimester, not applicable or unspecified: Secondary | ICD-10-CM | POA: Insufficient documentation

## 2019-07-07 DIAGNOSIS — O0990 Supervision of high risk pregnancy, unspecified, unspecified trimester: Secondary | ICD-10-CM

## 2019-07-07 DIAGNOSIS — Z3A22 22 weeks gestation of pregnancy: Secondary | ICD-10-CM

## 2019-07-07 MED ORDER — PREPLUS 27-1 MG PO TABS: 1.0000 | ORAL_TABLET | Freq: Every day | ORAL | 13 refills | Status: DC

## 2019-07-07 NOTE — Progress Notes (Signed)
Prenatal Visit Note Date: 07/07/2019 Clinic: [facility]  Subjective:  Taylor Lopez is a 38 y.o. G5P1004 at [redacted]w[redacted]d being seen today for ongoing prenatal care.  She is currently monitored for the following issues for this high-risk pregnancy and has Antepartum transient hypertension; Antepartum multigravida of advanced maternal age; Supervision of high risk pregnancy, antepartum; Language barrier; Placenta previa in second trimester; and IUGR (intrauterine growth restriction) affecting care of mother on their problem list.  Patient reports no complaints.   Contractions: Not present. Vag. Bleeding: None.  Movement: Present. Denies leaking of fluid.   The following portions of the patient's history were reviewed and updated as appropriate: allergies, current medications, past family history, past medical history, past social history, past surgical history and problem list. Problem list updated.  Objective:   Vitals:   07/07/19 1419  BP: 123/72  Pulse: 99  Weight: 182 lb (82.6 kg)    Fetal Status: Fetal Heart Rate (bpm): 141   Movement: Present     General:  Alert, oriented and cooperative. Patient is in no acute distress.  Skin: Skin is warm and dry. No rash noted.   Cardiovascular: Normal heart rate noted  Respiratory: Normal respiratory effort, no problems with respiration noted  Abdomen: Soft, gravid, appropriate for gestational age. Pain/Pressure: Absent     Pelvic:  Cervical exam deferred        Extremities: Normal range of motion.  Edema: None  Mental Status: Normal mood and affect. Normal behavior. Normal judgment and thought content.   Urinalysis:      Assessment and Plan:  Pregnancy: [id number] at [redacted]w[redacted]d  1. Placenta previa in second trimester On 10/5 [place] u/s at [facility], anterior complete. Recommend pelvic rest and to go to [facility] for any bleeding or spotting. F/u mfm u/s, see below - [place] MFM OB DETAIL +14 WK; Future  2. Poor fetal growth affecting  management of mother in second trimester, single or unspecified fetus 9% on [facility] u/s. Recommend mfm u/s in 1-2wks and pt amenable to this - [place] MFM OB DETAIL +14 WK; Future  3. Supervision of high risk pregnancy, antepartum Routine care  4. Language barrier Interpreter used  5. Antepartum multigravida of advanced maternal age F/u mfm u/s. Neg afp tetra  Preterm labor symptoms and general obstetric precautions including but not limited to vaginal bleeding, contractions, leaking of fluid and fetal movement were reviewed in detail with the patient. Please refer to After Visit Summary for other counseling recommendations.  Return in about 3 weeks (around 07/28/2019).   Aletha Halim, MD

## 2019-07-14 ENCOUNTER — Ambulatory Visit (HOSPITAL_COMMUNITY)
Admission: RE | Admit: 2019-07-14 | Discharge: 2019-07-14 | Disposition: A | Discharge status: Home / Self Care | Payer: Self-pay | Source: Ambulatory Visit | Attending: Obstetrics and Gynecology | Admitting: Obstetrics and Gynecology

## 2019-07-14 ENCOUNTER — Encounter (HOSPITAL_COMMUNITY): Payer: Self-pay | Admitting: *Deleted

## 2019-07-14 ENCOUNTER — Other Ambulatory Visit: Payer: Self-pay

## 2019-07-14 ENCOUNTER — Ambulatory Visit (HOSPITAL_COMMUNITY): Payer: Self-pay | Admitting: *Deleted

## 2019-07-14 VITALS — BP 126/68 | HR 89 | Temp 98.2°F

## 2019-07-14 DIAGNOSIS — O365921 Maternal care for other known or suspected poor fetal growth, second trimester, fetus 1: Secondary | ICD-10-CM

## 2019-07-14 DIAGNOSIS — O09529 Supervision of elderly multigravida, unspecified trimester: Secondary | ICD-10-CM | POA: Insufficient documentation

## 2019-07-14 DIAGNOSIS — O09212 Supervision of pregnancy with history of pre-term labor, second trimester: Secondary | ICD-10-CM

## 2019-07-14 DIAGNOSIS — O4402 Placenta previa specified as without hemorrhage, second trimester: Secondary | ICD-10-CM | POA: Insufficient documentation

## 2019-07-14 DIAGNOSIS — O365920 Maternal care for other known or suspected poor fetal growth, second trimester, not applicable or unspecified: Secondary | ICD-10-CM | POA: Insufficient documentation

## 2019-07-14 DIAGNOSIS — Z3A23 23 weeks gestation of pregnancy: Secondary | ICD-10-CM

## 2019-07-14 NOTE — Progress Notes (Signed)
Taylor Lopez, interpreter with patient

## 2019-07-15 ENCOUNTER — Other Ambulatory Visit (HOSPITAL_COMMUNITY): Payer: Self-pay | Admitting: *Deleted

## 2019-07-15 ENCOUNTER — Telehealth: Payer: Self-pay | Admitting: *Deleted

## 2019-07-15 DIAGNOSIS — Z362 Encounter for other antenatal screening follow-up: Secondary | ICD-10-CM

## 2019-07-15 NOTE — Telephone Encounter (Signed)
I called Verdis Frederickson with [facility] and notified her of Dr. Ilda Basset recommendation . She asked why it was ordered . I explained it was because they could not quite see everything to check anatomy. She states she would like to cancel the appointment ; but she may discuss with provider again at next ob visit. I notified MFM to cancel the appointment. Per note is [facility] patient .  Shoshanna Mcquitty,RN

## 2019-07-15 NOTE — Telephone Encounter (Signed)
-----   Message from Aletha Halim, MD sent at 07/15/2019 11:37 AM EDT ----- Can you let her know that if she wants to defer the follow up mfm anatomy ultrasound that that is okay? thanks

## 2019-07-28 ENCOUNTER — Ambulatory Visit (INDEPENDENT_AMBULATORY_CARE_PROVIDER_SITE_OTHER): Payer: Self-pay | Admitting: Obstetrics & Gynecology

## 2019-07-28 ENCOUNTER — Other Ambulatory Visit: Payer: Self-pay

## 2019-07-28 VITALS — BP 111/71 | HR 99 | Wt 187.0 lb

## 2019-07-28 DIAGNOSIS — O0992 Supervision of high risk pregnancy, unspecified, second trimester: Secondary | ICD-10-CM

## 2019-07-28 DIAGNOSIS — O09529 Supervision of elderly multigravida, unspecified trimester: Secondary | ICD-10-CM

## 2019-07-28 DIAGNOSIS — O0990 Supervision of high risk pregnancy, unspecified, unspecified trimester: Secondary | ICD-10-CM

## 2019-07-28 DIAGNOSIS — O09522 Supervision of elderly multigravida, second trimester: Secondary | ICD-10-CM

## 2019-07-28 DIAGNOSIS — Z3A25 25 weeks gestation of pregnancy: Secondary | ICD-10-CM

## 2019-07-28 NOTE — Patient Instructions (Addendum)
Regrese a la clinica cuando tenga su cita. Si tiene problemas o preguntas, llama a la clinica o vaya a la sala de emergencia al [employer].  https://www.cdc.gov/vaccines/hcp/vis/vis-statements/tdap.pdf">  [place] Tdap (contra el ttanos, la difteria y la tos [place]): lo que debe saber Tdap Vaccine (Tetanus, Diphtheria and Pertussis): What You Need to Know 1. Por qu vacunarse? El ttanos, la difteria y la tos Benetta Spar son enfermedades muy graves. La vacuna Tdap nos puede proteger de estas enfermedades. Adems, la vacuna Tdap que se aplica a las [address] proteger a los bebs recin nacidos contra la tos [place]. En la actualidad, el [place] (trismo) es una enfermedad poco frecuente en los [place]. Provoca la contraccin y el endurecimiento doloroso de los msculos, por lo general, de todo el cuerpo.  Puede causar el endurecimiento de los msculos de la cabeza y el cuello, de modo que impide abrir la boca, tragar y, en algunos casos, incluso respirar. El ttanos causa la muerte de aproximadamente 1de cada 10personas que contraen la infeccin, incluso despus de que reciben la mejor atencin mdica. La DIFTERIA tambin es poco frecuente en los [place] [employer]. Puede causar la formacin de una membrana gruesa en la parte posterior de la garganta.  Esto tiene como consecuencia problemas respiratorios, insuficiencia cardaca, parlisis y la [place]. La TOS FERINA (tos convulsiva) provoca episodios de tos intensa que pueden dificultar la respiracin y provocar vmitos y trastornos del sueo.  Tambin puede causar prdida de peso, incontinencia y fractura de [place]. Dos de cada 100 adolescentes y 5 de cada 100 adultos con tos ferina deben ser hospitalizados o tienen complicaciones, que pueden incluir neumona y Musician. Estas enfermedades son provocadas por bacterias. La difteria y la tos Benetta Spar se contagian de Neomia Dear persona a otra a travs de las secreciones de la tos o  el estornudo. El ttanos ingresa al organismo a travs de cortes, rasguos o heridas. Antes de las vacunas, en los [place] se informaban 200000 casos de difteria, 200000 casos de tos [place] y cientos de casos de ttanos cada ao. Desde el inicio de la vacunacin, los informes de casos de ttanos y difteria han disminuido alrededor del 99% y, los de tos [place], alrededor del 80%. 2. Madilyn Fireman Tdap La vacuna Tdap puede proteger a adolescentes y adultos contra el ttanos, la difteria y la tos [place]. Se administra una dosis de Tdap a los 11 o 12 aos. Las [employer] no recibieron la vacuna Tdap a esa edad deben recibirla tan pronto como sea posible. Es muy importante que los mdicos y todos aquellos que tengan contacto cercano con bebs menores de reciban la vacuna Tdap. Las mujeres deben recibir una dosis de la vacuna Tdap en cada embarazo para proteger al recin nacido de la tos [place]. Los bebs tienen mayor riesgo de sufrir complicaciones graves y potencialmente mortales debido a la tos [place]. Otra vacuna llamada Td protege contra el ttanos y la difteria, pero no contra la tos [place]. Todos deberan recibir una dosis de refuerzo de Td cada 10 aos. La vacuna Tdap puede aplicarse como uno de estos refuerzos si nunca antes recibi esta vacuna. La vacuna Tdap tambin se puede aplicar despus de un corte o quemadura grave para prevenir la infeccin por ttanos. El mdico o la persona que le aplique la vacuna puede darle ms informacin al [employer]. La vacuna Tdap puede administrarse de manera segura simultneamente con otras vacunas. 3. Algunas personas no deben recibir Acupuncturist  persona que alguna vez tuvo una reaccin alrgica potencialmente mortal a Ardelia Mems dosis previa de cualquier vacuna contra el ttanos, la difteria o la tos [place], O que tenga una alergia grave a cualquiera de los componentes de esta vacuna, no debe recibir la vacuna Tdap. Informe a la persona que le aplica la  vacuna si tiene cualquier alergia grave.  Una persona que estuvo en estado de coma o sufri mltiples convulsiones en el trmino de los 7 das despus de recibir una dosis de DTP o DTaP durante su infancia, o despus de una dosis previa de Tdap, no debe recibir la vacuna Tdap, a menos que se haya encontrado otra causa que no fuera la vacuna. An puede recibir la vacuna Td.  Consulte con su mdico si: ? tiene convulsiones u otro problema del sistema nervioso, ? tuvo hinchazn o dolor intenso despus de cualquier vacuna contra la difteria, el ttanos o la tos [place], ? alguna vez ha sufrido el sndrome de Curator (SGB), ? no se siente Pharmacologist en que se ha programado la vacuna. 4. Riesgos Con cualquier medicamento, incluso las vacunas, existe la posibilidad de que aparezcan efectos secundarios. Suelen ser leves y desaparecen por s solos. Si bien es posible tener [place] graves, estas son Orlene Erm raras. La [employer] de las personas a las que se les aplica la vacuna Tdap no tienen ningn problema. Problemas leves despus de la vacuna Tdap (No interfirieron en las actividades)  Management consultant donde se aplic la vacuna (alrededor de 3 de cada 4 adolescentes o 2 de cada 3 adultos)  Enrojecimiento o Estate agent donde se aplic la vacuna (alrededor de 1 de cada 5 personas)  Fiebre leve de al menos 100,30F (38C) (alrededor de 1 cada 25 adolescentes o 1 de cada 100 adultos)  Dolor de Pensions consultant (alrededor de 3 o 4 de cada 10 personas)  Cansancio (alrededor de 1 de cada 3 o 4 personas)  Nuseas, vmitos, diarrea, dolor de estmago (1 de cada 4 adolescentes o 1 de cada 10 adultos)  Escalofros, dolores articulares (alrededor de 1de cada 10personas)  Dolores corporales (alrededor de 1de cada 3 o 4personas)  Erupcin cutnea, inflamacin de los ganglios (poco frecuente) Problemas moderados despus de recibir la vacuna Tdap (Interfirieron en las [employer], Armed forces training and education officer no exigieron  atencin mdica)  Management consultant donde se aplic la vacuna (1de cada 5 o 6)  Enrojecimiento o inflamacin en el lugar donde se aplic la vacuna (alrededor de 1 de cada 16adolescentes o 1 de cada 12adultos)  Fiebre de ms de 102F (38,8C) (alrededor de 1 de cada 100 adolescentes o 1 de cada 250 adultos)  Dolor de Pensions consultant (alrededor de 1de cada 7adolescentes o 1de cada 10adultos)  Nuseas, vmitos, diarrea, dolor de estmago (hasta 1 o 3 de cada 100 personas)  Hinchazn de todo el brazo en el que se aplic la vacuna (alrededor de 1de cada 500personas) Problemas graves despus de la vacuna Tdap (Impidieron Optometrist las actividades habituales y exigieron atencin mdica)  Inflamacin, dolor intenso, sangrado y enrojecimiento en el brazo en que se aplic la vacuna (poco frecuente). Problemas que podran ocurrir despus de cualquier vacuna:  A veces, las personas se desmayan despus de un procedimiento mdico, incluida la vacunacin. Permanecer sentado o recostado durante 55minutos puede ayudar a [employer] y las lesiones causadas por las cadas. Informe al mdico si se siente mareado, tiene cambios en la visin o zumbidos en los odos.  Algunas personas sienten  un dolor intenso en el hombro y tienen dificultad para mover el brazo donde se aplic la vacuna. Esto sucede con muy poca frecuencia.  Cualquier medicamento puede causar una reaccin alrgica grave. Dichas reacciones son Lynnae Sandhoffmuy poco frecuentes con una vacuna (se calcula que menos de 1en un milln de dosis) y se producen entre unos minutos y unas horas despus de la vacunacin. Al igual que con cualquier Automatic Datamedicamento, existe una probabilidad muy remota de que una vacuna cause una lesin grave o la [place]muerte. La seguridad de las vacunas se controla permanentemente. Para obtener ms informacin, visite: http://floyd.org/www.cdc.gov/vaccinesafety/ 5. Qu pasa si se presenta un problema grave? A qu signos debo estar atento?  Est atento  a la aparicin de signos preocupantes, como los de una reaccin alrgica grave, fiebre muy alta o comportamiento fuera de lo normal. Los signos de una reaccin alrgica grave pueden incluir ronchas, hinchazn de la cara y la garganta, dificultad para respirar, latidos cardacos acelerados, mareos y debilidad. Generalmente, estos comienzan entre unos pocos minutos y algunas horas despus de la vacunacin. Qu debo hacer?  Si usted piensa que se trata de una reaccin alrgica grave o de otra emergencia que no puede esperar, llame al 9-1-1 o dirjase al hospital ms cercano. De lo contrario, llame al mdico.  Despus, la reaccin debe informarse al Sistema de Informe de Eventos Adversos de [place] Lakes[place]acunas (Vaccine Adverse Event Reporting System, VAERS). El mdico puede presentar este informe, o bien puede hacerlo usted mismo a travs del sitio web de VAERS, en www.vaers.LAgents.nohhs.gov, o llamando al 267-609-53931-(830)822-7372. El ChapinSistema de Informe de Eventos Adversos de [place] IslandsVacunas no brinda recomendaciones mdicas. 6. SunTrustPrograma Nacional de Compensacin de Daos por American Electric PowerVacunas El ShawnachesterPrograma Nacional de Compensacin de Daos por Administrator, artsVacunas (National Vaccine Injury Compensation Program, VICP) es un programa federal que fue creado para Patent examinercompensar a las personas que puedan haber sufrido daos al recibir ciertas vacunas. Aquellas personas que consideren que han sufrido un dao como consecuencia de una vacuna y [place]urasquieran saber ms acerca del programa y de cmo presentar un reclamo, pueden llamar al [phone] o visitar el sitio web del VICP en SpiritualWord.atwww.hrsa.gov/vaccinecompensation. Hay un lmite de tiempo para presentar un reclamo de compensacin. 7. Cmo puedo obtener ms informacin?  Consulte a su mdico. Este puede darle el prospecto de la vacuna o recomendarle otras fuentes de informacin.  Comunquese con el servicio de salud de su localidad o [address] estado.  Comunquese con Building control surveyorlos Centers for Micron TechnologyDisease Control and Prevention, CDC (Centros para el  Control y la Prevencin de [place]Enfermedades): ? Llame al [phone] (1-800-CDC-INFO) o ? Visite el sitio Environmental managerweb de los CDC en PicCapture.uywww.cdc.gov/vaccines Declaracin de informacin sobre la vacuna Tdap (24/10/2013) Esta informacin no tiene Theme park managercomo fin reemplazar el consejo del mdico. Asegrese de hacerle al mdico cualquier pregunta que tenga. Document Released: 08/18/2012 Document Revised: 05/05/2018 Document Reviewed: 05/05/2018 Elsevier Interactive Patient Education  [employer]2020 Elsevier Inc.

## 2019-07-28 NOTE — Progress Notes (Signed)
   PRENATAL VISIT NOTE  Subjective:  Taylor Lopez is a 38 y.o. [phone] at [redacted]w[redacted]d being seen today for ongoing prenatal care.  Patient is Spanish-speaking only, Spanish interpreter present for this encounter. She is currently monitored for the following issues for this high-risk pregnancy and has Antepartum transient hypertension; Antepartum multigravida of advanced maternal age; Supervision of high risk pregnancy, antepartum; and Language barrier on their problem list.  Patient reports no complaints.  Contractions: Not present. Vag. Bleeding: None.  Movement: Present. Denies leaking of fluid.   The following portions of the patient's history were reviewed and updated as appropriate: allergies, current medications, past family history, past medical history, past social history, past surgical history and problem list.   Objective:   Vitals:   07/28/19 0838  BP: 111/71  Pulse: 99  Weight: 187 lb (84.8 kg)    Fetal Status: Fetal Heart Rate (bpm): 138 Fundal Height: 25 cm Movement: Present     General:  Alert, oriented and cooperative. Patient is in no acute distress.  Skin: Skin is warm and dry. No rash noted.   Cardiovascular: Normal heart rate noted  Respiratory: Normal respiratory effort, no problems with respiration noted  Abdomen: Soft, gravid, appropriate for gestational age.  Pain/Pressure: Absent     Pelvic: Cervical exam deferred        Extremities: Normal range of motion.  Edema: None  Mental Status: Normal mood and affect. Normal behavior. Normal judgment and thought content.   Assessment and Plan:  Pregnancy: [id number] at [redacted]w[redacted]d 1. Supervision of high risk pregnancy, antepartum 2. Antepartum multigravida of advanced maternal age Patient was confused as to her previous provider saying she needed bed rest.  On review of chart, there was a concern about possible previa, she was put on pelvic rest until the MFM scan showed resolved previa.  She was told that she did not need  any further pelvic rest for this indication.   Preterm labor symptoms and general obstetric precautions including but not limited to vaginal bleeding, contractions, leaking of fluid and fetal movement were reviewed in detail with the patient. Please refer to After Visit Summary for other counseling recommendations.   Return in about 3 weeks (around 08/18/2019) for 2 hr GTT, 3rd trimester labs, OFFICE OB Visit (Spanish).  Verita Schneiders, MD

## 2019-08-17 ENCOUNTER — Ambulatory Visit (INDEPENDENT_AMBULATORY_CARE_PROVIDER_SITE_OTHER): Payer: Self-pay | Admitting: Family Medicine

## 2019-08-17 ENCOUNTER — Other Ambulatory Visit: Payer: Self-pay

## 2019-08-17 VITALS — BP 119/76 | HR 106 | Wt 190.0 lb

## 2019-08-17 DIAGNOSIS — O0990 Supervision of high risk pregnancy, unspecified, unspecified trimester: Secondary | ICD-10-CM

## 2019-08-17 DIAGNOSIS — O0992 Supervision of high risk pregnancy, unspecified, second trimester: Secondary | ICD-10-CM

## 2019-08-17 DIAGNOSIS — O09522 Supervision of elderly multigravida, second trimester: Secondary | ICD-10-CM

## 2019-08-17 DIAGNOSIS — Z3A27 27 weeks gestation of pregnancy: Secondary | ICD-10-CM

## 2019-08-17 DIAGNOSIS — O09529 Supervision of elderly multigravida, unspecified trimester: Secondary | ICD-10-CM

## 2019-08-17 DIAGNOSIS — Z789 Other specified health status: Secondary | ICD-10-CM

## 2019-08-17 NOTE — Progress Notes (Signed)
   PRENATAL VISIT NOTE  Subjective:  Taylor Lopez is a 38 y.o. [phone] at [redacted]w[redacted]d being seen today for ongoing prenatal care.  She is currently monitored for the following issues for this high-risk pregnancy and has Antepartum transient hypertension; Antepartum multigravida of advanced maternal age; Supervision of high risk pregnancy, antepartum; and Language barrier on their problem list.  Patient reports no complaints.  Contractions: Not present. Vag. Bleeding: None.  Movement: Present. Denies leaking of fluid.   The following portions of the patient's history were reviewed and updated as appropriate: allergies, current medications, past family history, past medical history, past social history, past surgical history and problem list.   Objective:   Vitals:   08/17/19 0920  BP: 119/76  Pulse: (!) 106  Weight: 190 lb (86.2 kg)    Fetal Status: Fetal Heart Rate (bpm): 140   Movement: Present     General:  Alert, oriented and cooperative. Patient is in no acute distress.  Skin: Skin is warm and dry. No rash noted.   Cardiovascular: Normal heart rate noted  Respiratory: Normal respiratory effort, no problems with respiration noted  Abdomen: Soft, gravid, appropriate for gestational age.  Pain/Pressure: Present     Pelvic: Cervical exam deferred        Extremities: Normal range of motion.  Edema: None  Mental Status: Normal mood and affect. Normal behavior. Normal judgment and thought content.   Assessment and Plan:  Pregnancy: [id number] at [redacted]w[redacted]d 1. Supervision of high risk pregnancy, antepartum Has minor preganncy related complaints - low back pain and stretching in abdomen. Counseled on normal nature of these complaints Given note for [facility] to provider Tdap  Given BP cuff today to be able to convert to some telehealth visits in light of rising COVID 19 numbers.  Instructed on how to use and demonstrated in the office.  - 2 Hour GTT - CBC - RPR - HIV antibody  2. Antepartum  multigravida of advanced maternal age Declined NIP, Quad negative  3. Language barrier Has spanish interpreter with her today   Preterm labor symptoms and general obstetric precautions including but not limited to vaginal bleeding, contractions, leaking of fluid and fetal movement were reviewed in detail with the patient. Please refer to After Visit Summary for other counseling recommendations.   Return in about 2 weeks (around 08/31/2019) for Routine prenatal care, Telehealth/Virtual health OB Visit.  Future Appointments  Date Time Provider [place]  09/01/2019 10:15 AM Aletha Halim, MD [facility]    Caren Macadam, MD

## 2019-08-18 ENCOUNTER — Ambulatory Visit (HOSPITAL_COMMUNITY): Payer: Self-pay

## 2019-08-18 ENCOUNTER — Encounter (HOSPITAL_COMMUNITY): Payer: Self-pay

## 2019-08-18 LAB — CBC
Hematocrit: 37.1 % (ref 34.0–46.6)
Hemoglobin: 12.5 g/dL (ref 11.1–15.9)
MCH: 30.2 pg (ref 26.6–33.0)
MCHC: 33.7 g/dL (ref 31.5–35.7)
MCV: 90 fL (ref 79–97)
Platelets: 237 x10E3/uL (ref 150–450)
RBC: 4.14 x10E6/uL (ref 3.77–5.28)
RDW: 13.6 % (ref 11.7–15.4)
WBC: 9.5 x10E3/uL (ref 3.4–10.8)

## 2019-08-18 LAB — RPR: RPR Ser Ql: NONREACTIVE

## 2019-08-18 LAB — GLUCOSE TOLERANCE, 2 HOURS W/ 1HR
Glucose, 1 hour: 127 mg/dL (ref 65–179)
Glucose, 2 hour: 145 mg/dL (ref 65–152)
Glucose, Fasting: 89 mg/dL (ref 65–91)

## 2019-08-18 LAB — HIV ANTIBODY (ROUTINE TESTING W REFLEX): HIV Screen 4th Generation wRfx: NONREACTIVE

## 2019-09-01 ENCOUNTER — Ambulatory Visit (INDEPENDENT_AMBULATORY_CARE_PROVIDER_SITE_OTHER): Payer: Self-pay | Admitting: Obstetrics and Gynecology

## 2019-09-01 ENCOUNTER — Other Ambulatory Visit: Payer: Self-pay

## 2019-09-01 VITALS — BP 139/76 | HR 98 | Wt 190.0 lb

## 2019-09-01 DIAGNOSIS — O139 Gestational [pregnancy-induced] hypertension without significant proteinuria, unspecified trimester: Secondary | ICD-10-CM

## 2019-09-01 DIAGNOSIS — Z3A30 30 weeks gestation of pregnancy: Secondary | ICD-10-CM

## 2019-09-01 DIAGNOSIS — O0990 Supervision of high risk pregnancy, unspecified, unspecified trimester: Secondary | ICD-10-CM

## 2019-09-01 DIAGNOSIS — O0993 Supervision of high risk pregnancy, unspecified, third trimester: Secondary | ICD-10-CM

## 2019-09-01 DIAGNOSIS — N644 Mastodynia: Secondary | ICD-10-CM

## 2019-09-01 DIAGNOSIS — Z789 Other specified health status: Secondary | ICD-10-CM

## 2019-09-01 DIAGNOSIS — O133 Gestational [pregnancy-induced] hypertension without significant proteinuria, third trimester: Secondary | ICD-10-CM

## 2019-09-01 DIAGNOSIS — Z603 Acculturation difficulty: Secondary | ICD-10-CM

## 2019-09-01 DIAGNOSIS — O09523 Supervision of elderly multigravida, third trimester: Secondary | ICD-10-CM

## 2019-09-01 DIAGNOSIS — O09529 Supervision of elderly multigravida, unspecified trimester: Secondary | ICD-10-CM

## 2019-09-01 NOTE — Progress Notes (Signed)
Prenatal Visit Note Date: 09/01/2019 Clinic: [facility]  Subjective:  Taylor Lopez is a 38 y.o. [id number] at [redacted]w[redacted]d being seen today for ongoing prenatal care.  She is currently monitored for the following issues for this high-risk pregnancy and has Antepartum transient hypertension; Antepartum multigravida of advanced maternal age; Supervision of high risk pregnancy, antepartum; and Language barrier on their problem list.  Patient reports see below.   Contractions: Not present. Vag. Bleeding: None.  Movement: Present. Denies leaking of fluid.   The following portions of the patient's history were reviewed and updated as appropriate: allergies, current medications, past family history, past medical history, past social history, past surgical history and problem list. Problem list updated.  Objective:   Vitals:   09/01/19 1021  BP: 139/76  Pulse: 98  Weight: 190 lb (86.2 kg)    Fetal Status: Fetal Heart Rate (bpm): 150 Fundal Height: 30 cm Movement: Present     General:  Alert, oriented and cooperative. Patient is in no acute distress.  Skin: Skin is warm and dry. No rash noted.   Cardiovascular: Normal heart rate noted  Respiratory: Normal respiratory effort, no problems with respiration noted  Abdomen: Soft, gravid, appropriate for gestational age. Pain/Pressure: Absent     Pelvic:  Cervical exam deferred        Extremities: Normal range of motion.  Edema: None  Mental Status: Normal mood and affect. Normal behavior. Normal judgment and thought content.  Breast: normal inspection bilaterally. Normal palpation bilaterally  Urinalysis:      Assessment and Plan:  Pregnancy: [id number] at [redacted]w[redacted]d  1. Language barrier Interpreter used  2. Supervision of high risk pregnancy, antepartum Routine care  3. Antepartum multigravida of advanced maternal age No issues  4. Antepartum transient hypertension Normal BP today but borderline. F/u nv  5. Breast pain,  left States it has been ongoing for about a month, had it before she was pregnant in the past. She is wondering if it's b/c she sleeps on that side. No lumps, nipple changes, pain that she's felt. Pt wears a bra without a wire; she does feel that her  Breasts are bigger since being pregnant  Normal exam today  Recommend avoid pressure/sleeping on that side, try up sizing on bra and re-eval nv  Preterm labor symptoms and general obstetric precautions including but not limited to vaginal bleeding, contractions, leaking of fluid and fetal movement were reviewed in detail with the patient. Please refer to After Visit Summary for other counseling recommendations.  Return in about 2 weeks (around 09/15/2019) for low risk, in person.   Aletha Halim, MD

## 2019-09-01 NOTE — Progress Notes (Signed)
Breast pain on left side

## 2019-09-13 ENCOUNTER — Other Ambulatory Visit: Payer: Self-pay

## 2019-09-13 ENCOUNTER — Ambulatory Visit (INDEPENDENT_AMBULATORY_CARE_PROVIDER_SITE_OTHER): Payer: Self-pay | Admitting: Obstetrics & Gynecology

## 2019-09-13 VITALS — BP 114/70 | HR 98 | Wt 195.0 lb

## 2019-09-13 DIAGNOSIS — Z789 Other specified health status: Secondary | ICD-10-CM

## 2019-09-13 DIAGNOSIS — O0990 Supervision of high risk pregnancy, unspecified, unspecified trimester: Secondary | ICD-10-CM

## 2019-09-13 DIAGNOSIS — O0993 Supervision of high risk pregnancy, unspecified, third trimester: Secondary | ICD-10-CM

## 2019-09-13 DIAGNOSIS — Z3A30 30 weeks gestation of pregnancy: Secondary | ICD-10-CM

## 2019-09-13 NOTE — Progress Notes (Signed)
Interpreter # [phone] used. Kathrene Alu RN

## 2019-09-16 NOTE — L&D Delivery Note (Signed)
OB/GYN Faculty Practice Delivery Note  Taylor Lopez is a 39 y.o. [id number] s/p VD at [redacted]w[redacted]d. She was admitted for IOL for post-dates.   ROM: 5h 40m with clear fluid GBS Status: Negative/-- (02/02 1132)  Labor Progress: . Initial SVE: 2.5/thick/-3. Patient received Foley balloon, Pitocin, AROM and epidural. She then progressed to complete.   Delivery Date/Time: 3/2 @ [id number] Delivery: Called to room and patient was complete and pushing. Head delivered in LOA position. No nuchal cord present. Shoulder and body delivered in usual fashion. Infant with spontaneous cry, placed on mother's abdomen, dried and stimulated. Cord clamped x 2 after 1-minute delay, and cut by FOB. Cord blood drawn. Placenta delivered spontaneously with gentle cord traction. Fundus firm with massage and Pitocin. Labia, perineum, vagina, and cervix inspected inspected with small abrasion over perineum which was oozing and repaired with 4-0 Vicryl. Of note, patient with two pieces of vaginal tissue that had re-healed from prior repair at delivery in a circle. Patient agreeable to removable. Both ends tied with 3-0 Vicryl and subsequently trimmed of excess tissue. Patient also with large skin tag on left thigh. She was also asked if she would like this removed and agreeable. Local lidocaine placed underneath skin tag creating wheel and then subsequently cut off. Minimal bleeding noted and one suture of 3-0 Vicryl placed to achieve hemostasis. Baby Weight: pending  Placenta: Sent to L&D Complications: None Lacerations: Small perineal abrasion EBL: 176 mL Analgesia: Epidural and local lidocaine for skin tag removal    Infant: APGAR (1 MIN): 8   APGAR (5 MINS): 9   APGAR (10 MINS):     Jerilynn Birkenhead, MD OB Family Medicine Fellow, [facility] for [facility], [facility] 11/15/2019, 7:41 AM

## 2019-10-04 ENCOUNTER — Ambulatory Visit (INDEPENDENT_AMBULATORY_CARE_PROVIDER_SITE_OTHER): Payer: Self-pay | Admitting: Obstetrics and Gynecology

## 2019-10-04 ENCOUNTER — Other Ambulatory Visit: Payer: Self-pay

## 2019-10-04 VITALS — BP 124/76 | HR 105 | Wt 197.0 lb

## 2019-10-04 DIAGNOSIS — O09529 Supervision of elderly multigravida, unspecified trimester: Secondary | ICD-10-CM

## 2019-10-04 DIAGNOSIS — Z3A34 34 weeks gestation of pregnancy: Secondary | ICD-10-CM

## 2019-10-04 DIAGNOSIS — N644 Mastodynia: Secondary | ICD-10-CM

## 2019-10-04 DIAGNOSIS — O0990 Supervision of high risk pregnancy, unspecified, unspecified trimester: Secondary | ICD-10-CM

## 2019-10-04 DIAGNOSIS — O09523 Supervision of elderly multigravida, third trimester: Secondary | ICD-10-CM

## 2019-10-04 DIAGNOSIS — Z789 Other specified health status: Secondary | ICD-10-CM

## 2019-10-04 DIAGNOSIS — O0993 Supervision of high risk pregnancy, unspecified, third trimester: Secondary | ICD-10-CM

## 2019-10-04 NOTE — Progress Notes (Signed)
Prenatal Visit Note Date: 10/04/2019 Clinic: [facility]  Subjective:  Taylor Lopez is a 39 y.o. [id number] at [redacted]w[redacted]d being seen today for ongoing prenatal care.  She is currently monitored for the following issues for this high-risk pregnancy and has Antepartum transient hypertension; Antepartum multigravida of advanced maternal age; Supervision of high risk pregnancy, antepartum; Language barrier; and Breast pain, left on their problem list.  Patient reports no complaints.   Contractions: Irregular. Vag. Bleeding: None.  Movement: Present. Denies leaking of fluid.   The following portions of the patient's history were reviewed and updated as appropriate: allergies, current medications, past family history, past medical history, past social history, past surgical history and problem list. Problem list updated.  Objective:   Vitals:   10/04/19 1034  BP: 124/76  Pulse: (!) 105  Weight: 197 lb (89.4 kg)    Fetal Status: Fetal Heart Rate (bpm): 131   Movement: Present     General:  Alert, oriented and cooperative. Patient is in no acute distress.  Skin: Skin is warm and dry. No rash noted.   Cardiovascular: Normal heart rate noted  Respiratory: Normal respiratory effort, no problems with respiration noted  Abdomen: Soft, gravid, appropriate for gestational age. Pain/Pressure: Present     Pelvic:  Cervical exam deferred        Extremities: Normal range of motion.  Edema: None  Mental Status: Normal mood and affect. Normal behavior. Normal judgment and thought content.   Urinalysis:      Assessment and Plan:  Pregnancy: [id number] at [redacted]w[redacted]d  1. Breast pain, left improved  2. Antepartum multigravida of advanced maternal age No issues  3. Language barrier Interpreter used  4. Supervision of high risk pregnancy, antepartum Routine care. D/w her re: depo bridge. nexplanon at [facility]  Preterm labor symptoms and general obstetric precautions including but not  limited to vaginal bleeding, contractions, leaking of fluid and fetal movement were reviewed in detail with the patient. Please refer to After Visit Summary for other counseling recommendations.  Return in about 2 weeks (around 10/18/2019) for in person.   [place], MD

## 2019-10-18 ENCOUNTER — Other Ambulatory Visit: Payer: Self-pay

## 2019-10-18 ENCOUNTER — Ambulatory Visit (INDEPENDENT_AMBULATORY_CARE_PROVIDER_SITE_OTHER): Payer: Self-pay | Admitting: Family Medicine

## 2019-10-18 VITALS — BP 124/77 | HR 112 | Wt 202.0 lb

## 2019-10-18 DIAGNOSIS — O09523 Supervision of elderly multigravida, third trimester: Secondary | ICD-10-CM

## 2019-10-18 DIAGNOSIS — O0990 Supervision of high risk pregnancy, unspecified, unspecified trimester: Secondary | ICD-10-CM

## 2019-10-18 DIAGNOSIS — Z789 Other specified health status: Secondary | ICD-10-CM

## 2019-10-18 DIAGNOSIS — O139 Gestational [pregnancy-induced] hypertension without significant proteinuria, unspecified trimester: Secondary | ICD-10-CM

## 2019-10-18 DIAGNOSIS — Z3A36 36 weeks gestation of pregnancy: Secondary | ICD-10-CM

## 2019-10-18 DIAGNOSIS — Z113 Encounter for screening for infections with a predominantly sexual mode of transmission: Secondary | ICD-10-CM

## 2019-10-18 DIAGNOSIS — O133 Gestational [pregnancy-induced] hypertension without significant proteinuria, third trimester: Secondary | ICD-10-CM

## 2019-10-18 DIAGNOSIS — O09529 Supervision of elderly multigravida, unspecified trimester: Secondary | ICD-10-CM

## 2019-10-18 NOTE — Progress Notes (Signed)
   PRENATAL VISIT NOTE  Subjective:  Taylor Lopez is a 39 y.o. [phone] at [redacted]w[redacted]d being seen today for ongoing prenatal care.  She is currently monitored for the following issues for this high-risk pregnancy and has Antepartum transient hypertension; Antepartum multigravida of advanced maternal age; Supervision of high risk pregnancy, antepartum; Language barrier; and Breast pain, left on their problem list.  Patient reports no complaints.  Contractions: Not present. Vag. Bleeding: None.  Movement: Present. Denies leaking of fluid.   The following portions of the patient's history were reviewed and updated as appropriate: allergies, current medications, past family history, past medical history, past social history, past surgical history and problem list.   Objective:   Vitals:   10/18/19 1102  BP: 124/77  Pulse: (!) 112  Weight: 202 lb (91.6 kg)    Fetal Status: Fetal Heart Rate (bpm): 131 Fundal Height: 41 cm Movement: Present  Presentation: Vertex  General:  Alert, oriented and cooperative. Patient is in no acute distress.  Skin: Skin is warm and dry. No rash noted.   Cardiovascular: Normal heart rate noted  Respiratory: Normal respiratory effort, no problems with respiration noted  Abdomen: Soft, gravid, appropriate for gestational age.  Pain/Pressure: Present     Pelvic: Cervical exam performed Dilation: 1.5 Effacement (%): 20 Station: Ballotable  Extremities: Normal range of motion.  Edema: None  Mental Status: Normal mood and affect. Normal behavior. Normal judgment and thought content.   Assessment and Plan:  Pregnancy: [id number] at [redacted]w[redacted]d 1. Supervision of high risk pregnancy, antepartum Cultures today - GC/Chlamydia probe amp ([place])not at [facility] - Culture, beta strep (group b only)  2. Antepartum transient hypertension BP is ok today  3. Antepartum multigravida of advanced maternal age Declined testing  4. Language barrier Video Spanish interpreter:  used    Preterm labor symptoms and general obstetric precautions including but not limited to vaginal bleeding, contractions, leaking of fluid and fetal movement were reviewed in detail with the patient. Please refer to After Visit Summary for other counseling recommendations.   Return today (on 10/18/2019) for in person.  Future Appointments  Date Time Provider Department Center  10/27/2019  8:45 AM Reva Bores, MD [facility]    Reva Bores, MD

## 2019-10-18 NOTE — Patient Instructions (Signed)
 Lactancia materna Breastfeeding  Decidir amamantar es una de las mejores elecciones que puede hacer por usted y su beb. Un cambio en las hormonas durante el embarazo hace que las mamas produzcan leche materna en las glndulas productoras de leche. Las hormonas impiden que la leche materna sea liberada antes del nacimiento del beb. Adems, impulsan el flujo de leche luego del nacimiento. Una vez que ha comenzado a amamantar, pensar en el beb, as como la succin o el llanto, pueden estimular la liberacin de leche de las glndulas productoras de leche. Los beneficios de amamantar Las investigaciones demuestran que la lactancia materna ofrece muchos beneficios de salud para bebs y madres. Adems, ofrece una forma gratuita y conveniente de alimentar al beb. Para el beb  La primera leche (calostro) ayuda a mejorar el funcionamiento del aparato digestivo del beb.  Las clulas especiales de la leche (anticuerpos) ayudan a combatir las infecciones en el beb.  Los bebs que se alimentan con leche materna tambin tienen menos probabilidades de tener asma, alergias, obesidad o diabetes de tipo 2. Adems, tienen menor riesgo de sufrir el sndrome de muerte sbita del lactante (SMSL).  Los nutrientes de la leche materna son mejores para satisfacer las necesidades del beb en comparacin con la leche maternizada.  La leche materna mejora el desarrollo cerebral del beb. Para usted  La lactancia materna favorece el desarrollo de un vnculo muy especial entre la madre y el beb.  Es conveniente. La leche materna es econmica y siempre est disponible a la temperatura correcta.  La lactancia materna ayuda a quemar caloras. Le ayuda a perder el peso ganado durante el embarazo.  Hace que el tero vuelva al tamao que tena antes del embarazo ms rpido. Adems, disminuye el sangrado (loquios) despus del parto.  La lactancia materna contribuye a reducir el riesgo de tener diabetes de tipo 2,  osteoporosis, artritis reumatoide, enfermedades cardiovasculares y cncer de mama, ovario, tero y endometrio en el futuro. Informacin bsica sobre la lactancia Comienzo de la lactancia  Encuentre un lugar cmodo para sentarse o acostarse, con un buen respaldo para el cuello y la espalda.  Coloque una almohada o una manta enrollada debajo del beb para acomodarlo a la altura de la mama (si est sentada). Las almohadas para amamantar se han diseado especialmente a fin de servir de apoyo para los brazos y el beb mientras amamanta.  Asegrese de que la barriga del beb (abdomen) est frente a la suya.  Masajee suavemente la mama. Con las yemas de los dedos, masajee los bordes exteriores de la mama hacia adentro, en direccin al pezn. Esto estimula el flujo de leche. Si la leche fluye lentamente, es posible que deba continuar con este movimiento durante la lactancia.  Sostenga la mama con 4 dedos por debajo y el pulgar por arriba del pezn (forme la letra "C" con la mano). Asegrese de que los dedos se encuentren lejos del pezn y de la boca del beb.  Empuje suavemente los labios del beb con el pezn o con el dedo.  Cuando la boca del beb se abra lo suficiente, acrquelo rpidamente a la mama e introduzca todo el pezn y la arola, tanto como sea posible, dentro de la boca del beb. La arola es la zona de color que rodea al pezn. ? Debe haber ms arola visible por arriba del labio superior del beb que por debajo del labio inferior. ? Los labios del beb deben estar abiertos y extendidos hacia afuera (evertidos) para asegurar   que el beb se prenda de forma adecuada y cmoda. ? La lengua del beb debe estar entre la enca inferior y la mama.  Asegrese de que la boca del beb est en la posicin correcta alrededor del pezn (prendido). Los labios del beb deben crear un sello sobre la mama y estar doblados hacia afuera (invertidos).  Es comn que el beb succione durante 2 a 3 minutos  para que comience el flujo de leche materna. Cmo debe prenderse Es muy importante que le ensee al beb cmo prenderse adecuadamente a la mama. Si el beb no se prende adecuadamente, puede causar dolor en los pezones, reducir la produccin de leche materna y hacer que el beb tenga un escaso aumento de peso. Adems, si el beb no se prende adecuadamente al pezn, puede tragar aire durante la alimentacin. Esto puede causarle molestias al beb. Hacer eructar al beb al cambiar de mama puede ayudarlo a liberar el aire. Sin embargo, ensearle al beb cmo prenderse a la mama adecuadamente es la mejor manera de evitar que se sienta molesto por tragar aire mientras se alimenta. Signos de que el beb se ha prendido adecuadamente al pezn  Tironea o succiona de modo silencioso, sin causarle dolor. Los labios del beb deben estar extendidos hacia afuera (evertidos).  Se escucha que traga cada 3 o 4 succiones una vez que la leche ha comenzado a fluir (despus de que se produzca el reflejo de eyeccin de la leche).  Hay movimientos musculares por arriba y por delante de sus odos al succionar. Signos de que el beb no se ha prendido adecuadamente al pezn  Hace ruidos de succin o de chasquido mientras se alimenta.  Siente dolor en los pezones. Si cree que el beb no se prendi correctamente, deslice el dedo en la comisura de la boca y colquelo entre las encas del beb para interrumpir la succin. Intente volver a comenzar a amamantar. Signos de lactancia materna exitosa Signos del beb  El beb disminuir gradualmente el nmero de succiones o dejar de succionar por completo.  El beb se quedar dormido.  El cuerpo del beb se relajar.  El beb retendr una pequea cantidad de leche en la boca.  El beb se desprender solo del pecho. Signos que presenta usted  Las mamas han aumentado la firmeza, el peso y el tamao 1 a 3 horas despus de amamantar.  Estn ms blandas inmediatamente despus  de amamantar.  Se producen un aumento del volumen de leche y un cambio en su consistencia y color hacia el quinto da de lactancia.  Los pezones no duelen, no estn agrietados ni sangran. Signos de que su beb recibe la cantidad de leche suficiente  Mojar por lo menos 1 o 2paales durante las primeras 24horas despus del nacimiento.  Mojar por lo menos 5 o 6paales cada 24horas durante la primera semana despus del nacimiento. La orina debe ser clara o de color amarillo plido a los 5das de vida.  Mojar entre 6 y 8paales cada 24horas a medida que el beb sigue creciendo y desarrollndose.  Defeca por lo menos 3 veces en 24 horas a los 5 das de vida. Las heces deben ser blandas y amarillentas.  Defeca por lo menos 3 veces en 24 horas a los 7 das de vida. Las heces deben ser grumosas y amarillentas.  No registra una prdida de peso mayor al 10% del peso al nacer durante los primeros 3 das de vida.  Aumenta de peso un promedio de 4   a 7onzas (113 a 198g) por semana despus de los 4 das de vida.  Aumenta de peso, diariamente, de manera uniforme a partir de los 5 das de vida, sin registrar prdida de peso despus de las 2semanas de vida. Despus de alimentarse, es posible que el beb regurgite una pequea cantidad de leche. Esto es normal. Frecuencia y duracin de la lactancia El amamantamiento frecuente la ayudar a producir ms leche y puede prevenir dolores en los pezones y las mamas extremadamente llenas (congestin mamaria). Alimente al beb cuando muestre signos de hambre o si siente la necesidad de reducir la congestin de las mamas. Esto se denomina "lactancia a demanda". Las seales de que el beb tiene hambre incluyen las siguientes:  Aumento del estado de alerta, actividad o inquietud.  Mueve la cabeza de un lado a otro.  Abre la boca cuando se le toca la mejilla o la comisura de la boca (reflejo de bsqueda).  Aumenta las vocalizaciones, tales como sonidos de  succin, se relame los labios, emite arrullos, suspiros o chirridos.  Mueve la mano hacia la boca y se chupa los dedos o las manos.  Est molesto o llora. Evite el uso del chupete en las primeras 4 a 6 semanas despus del nacimiento del beb. Despus de este perodo, podr usar un chupete. Las investigaciones demostraron que el uso del chupete durante el primer ao de vida del beb disminuye el riesgo de tener el sndrome de muerte sbita del lactante (SMSL). Permita que el nio se alimente en cada mama todo lo que desee. Cuando el beb se desprende o se queda dormido mientras se est alimentando de la primera mama, ofrzcale la segunda. Debido a que, con frecuencia, los recin nacidos estn somnolientos las primeras semanas de vida, es posible que deba despertar al beb para alimentarlo. Los horarios de lactancia varan de un beb a otro. Sin embargo, las siguientes reglas pueden servir como gua para ayudarla a garantizar que el beb se alimenta adecuadamente:  Se puede amamantar a los recin nacidos (bebs de 4 semanas o menos de vida) cada 1 a 3 horas.  No deben transcurrir ms de 3 horas durante el da o 5 horas durante la noche sin que se amamante a los recin nacidos.  Debe amamantar al beb un mnimo de 8 veces en un perodo de 24 horas. Extraccin de leche materna     La extraccin y el almacenamiento de la leche materna le permiten asegurarse de que el beb se alimente exclusivamente de su leche materna, aun en momentos en los que no puede amamantar. Esto tiene especial importancia si debe regresar al trabajo en el perodo en que an est amamantando o si no puede estar presente en los momentos en que el beb debe alimentarse. Su asesor en lactancia puede ayudarla a encontrar un mtodo de extraccin que funcione mejor para usted y orientarla sobre cunto tiempo es seguro almacenar leche materna. Cmo cuidar las mamas durante la lactancia Los pezones pueden secarse, agrietarse y doler  durante la lactancia. Las siguientes recomendaciones pueden ayudarla a mantener las mamas humectadas y sanas:  Evite usar jabn en los pezones.  Use un sostn de soporte diseado especialmente para la lactancia materna. Evite usar sostenes con aro o sostenes muy ajustados (sostenes deportivos).  Seque al aire sus pezones durante 3 a 4minutos despus de amamantar al beb.  Utilice solo apsitos de algodn en el sostn para absorber las prdidas de leche. La prdida de un poco de leche materna entre   las tomas es normal.  Utilice lanolina sobre los pezones luego de amamantar. La lanolina ayuda a mantener la humedad normal de la piel. La lanolina pura no es perjudicial (no es txica) para el beb. Adems, puede extraer manualmente algunas gotas de leche materna y masajear suavemente esa leche sobre los pezones para que la leche se seque al aire. Durante las primeras semanas despus del nacimiento, algunas mujeres experimentan congestin mamaria. La congestin mamaria puede hacer que sienta las mamas pesadas, calientes y sensibles al tacto. El pico de la congestin mamaria ocurre en el plazo de los 3 a 5 das despus del parto. Las siguientes recomendaciones pueden ayudarla a aliviar la congestin mamaria:  Vace por completo las mamas al amamantar o extraer leche. Puede aplicar calor hmedo en las mamas (en la ducha o con toallas hmedas para manos) antes de amamantar o extraer leche. Esto aumenta la circulacin y ayuda a que la leche fluya. Si el beb no vaca por completo las mamas cuando lo amamanta, extraiga la leche restante despus de que haya finalizado.  Aplique compresas de hielo sobre las mamas inmediatamente despus de amamantar o extraer leche, a menos que le resulte demasiado incmodo. Haga lo siguiente: ? Ponga el hielo en una bolsa plstica. ? Coloque una toalla entre la piel y la bolsa de hielo. ? Coloque el hielo durante 20minutos, 2 o 3veces por da.  Asegrese de que el beb  est prendido y se encuentre en la posicin correcta mientras lo alimenta. Si la congestin mamaria persiste luego de 48 horas o despus de seguir estas recomendaciones, comunquese con su mdico o un asesor en lactancia. Recomendaciones de salud general durante la lactancia  Consuma 3 comidas y 3 colaciones saludables todos los das. Las madres bien alimentadas que amamantan necesitan entre 450 y 500 caloras adicionales por da. Puede cumplir con este requisito al aumentar la cantidad de una dieta equilibrada que realice.  Beba suficiente agua para mantener la orina clara o de color amarillo plido.  Descanse con frecuencia, reljese y siga tomando sus vitaminas prenatales para prevenir la fatiga, el estrs y los niveles bajos de vitaminas y minerales en el cuerpo (deficiencias de nutrientes).  No consuma ningn producto que contenga nicotina o tabaco, como cigarrillos y cigarrillos electrnicos. El beb puede verse afectado por las sustancias qumicas de los cigarrillos que pasan a la leche materna y por la exposicin al humo ambiental del tabaco. Si necesita ayuda para dejar de fumar, consulte al mdico.  Evite el consumo de alcohol.  No consuma drogas ilegales o marihuana.  Antes de usar cualquier medicamento, hable con el mdico. Estos incluyen medicamentos recetados y de venta libre, como tambin vitaminas y suplementos a base de hierbas. Algunos medicamentos, que pueden ser perjudiciales para el beb, pueden pasar a travs de la leche materna.  Puede quedar embarazada durante la lactancia. Si se desea un mtodo anticonceptivo, consulte al mdico sobre cules son las opciones seguras durante la lactancia. Dnde encontrar ms informacin: Liga internacional La Leche: www.llli.org. Comunquese con un mdico si:  Siente que quiere dejar de amamantar o se siente frustrada con la lactancia.  Sus pezones estn agrietados o sangran.  Sus mamas estn irritadas, sensibles o  calientes.  Tiene los siguientes sntomas: ? Dolor en las mamas o en los pezones. ? Un rea hinchada en cualquiera de las mamas. ? Fiebre o escalofros. ? Nuseas o vmitos. ? Drenaje de otro lquido distinto de la leche materna desde los pezones.  Sus mamas no   se llenan antes de amamantar al beb para el quinto da despus del parto.  Se siente triste y deprimida.  El beb: ? Est demasiado somnoliento como para comer bien. ? Tiene problemas para dormir. ? Tiene ms de 1 semana de vida y moja menos de 6 paales en un periodo de 24 horas. ? No ha aumentado de peso a los 5 das de vida.  El beb defeca menos de 3 veces en 24 horas.  La piel del beb o las partes blancas de los ojos se vuelven amarillentas. Solicite ayuda de inmediato si:  El beb est muy cansado (letargo) y no se quiere despertar para comer.  Le sube la fiebre sin causa. Resumen  La lactancia materna ofrece muchos beneficios de salud para bebs y madres.  Intente amamantar a su beb cuando muestre signos tempranos de hambre.  Haga cosquillas o empuje suavemente los labios del beb con el dedo o el pezn para lograr que el beb abra la boca. Acerque el beb a la mama. Asegrese de que la mayor parte de la arola se encuentre dentro de la boca del beb. Ofrzcale una mama y haga eructar al beb antes de pasar a la otra.  Hable con su mdico o asesor en lactancia si tiene dudas o problemas con la lactancia. Esta informacin no tiene como fin reemplazar el consejo del mdico. Asegrese de hacerle al mdico cualquier pregunta que tenga. Document Revised: 11/26/2017 Document Reviewed: 12/22/2016 Elsevier Patient Education  2020 Elsevier Inc.  

## 2019-10-19 LAB — GC/CHLAMYDIA PROBE AMP (~~LOC~~) NOT AT ARMC
Chlamydia: NEGATIVE
Comment: NEGATIVE
Comment: NORMAL
Neisseria Gonorrhea: NEGATIVE

## 2019-10-22 LAB — CULTURE, BETA STREP (GROUP B ONLY): Strep Gp B Culture: NEGATIVE

## 2019-10-27 ENCOUNTER — Other Ambulatory Visit: Payer: Self-pay

## 2019-10-27 ENCOUNTER — Ambulatory Visit (INDEPENDENT_AMBULATORY_CARE_PROVIDER_SITE_OTHER): Payer: Self-pay | Admitting: Family Medicine

## 2019-10-27 VITALS — BP 137/81 | HR 99 | Wt 202.0 lb

## 2019-10-27 DIAGNOSIS — Z789 Other specified health status: Secondary | ICD-10-CM

## 2019-10-27 DIAGNOSIS — O09529 Supervision of elderly multigravida, unspecified trimester: Secondary | ICD-10-CM

## 2019-10-27 DIAGNOSIS — Z3A38 38 weeks gestation of pregnancy: Secondary | ICD-10-CM

## 2019-10-27 DIAGNOSIS — O09523 Supervision of elderly multigravida, third trimester: Secondary | ICD-10-CM

## 2019-10-27 DIAGNOSIS — O0990 Supervision of high risk pregnancy, unspecified, unspecified trimester: Secondary | ICD-10-CM

## 2019-10-27 NOTE — Patient Instructions (Signed)
 Lactancia materna Breastfeeding  Decidir amamantar es una de las mejores elecciones que puede hacer por usted y su beb. Un cambio en las hormonas durante el embarazo hace que las mamas produzcan leche materna en las glndulas productoras de leche. Las hormonas impiden que la leche materna sea liberada antes del nacimiento del beb. Adems, impulsan el flujo de leche luego del nacimiento. Una vez que ha comenzado a amamantar, pensar en el beb, as como la succin o el llanto, pueden estimular la liberacin de leche de las glndulas productoras de leche. Los beneficios de amamantar Las investigaciones demuestran que la lactancia materna ofrece muchos beneficios de salud para bebs y madres. Adems, ofrece una forma gratuita y conveniente de alimentar al beb. Para el beb  La primera leche (calostro) ayuda a mejorar el funcionamiento del aparato digestivo del beb.  Las clulas especiales de la leche (anticuerpos) ayudan a combatir las infecciones en el beb.  Los bebs que se alimentan con leche materna tambin tienen menos probabilidades de tener asma, alergias, obesidad o diabetes de tipo 2. Adems, tienen menor riesgo de sufrir el sndrome de muerte sbita del lactante (SMSL).  Los nutrientes de la leche materna son mejores para satisfacer las necesidades del beb en comparacin con la leche maternizada.  La leche materna mejora el desarrollo cerebral del beb. Para usted  La lactancia materna favorece el desarrollo de un vnculo muy especial entre la madre y el beb.  Es conveniente. La leche materna es econmica y siempre est disponible a la temperatura correcta.  La lactancia materna ayuda a quemar caloras. Le ayuda a perder el peso ganado durante el embarazo.  Hace que el tero vuelva al tamao que tena antes del embarazo ms rpido. Adems, disminuye el sangrado (loquios) despus del parto.  La lactancia materna contribuye a reducir el riesgo de tener diabetes de tipo 2,  osteoporosis, artritis reumatoide, enfermedades cardiovasculares y cncer de mama, ovario, tero y endometrio en el futuro. Informacin bsica sobre la lactancia Comienzo de la lactancia  Encuentre un lugar cmodo para sentarse o acostarse, con un buen respaldo para el cuello y la espalda.  Coloque una almohada o una manta enrollada debajo del beb para acomodarlo a la altura de la mama (si est sentada). Las almohadas para amamantar se han diseado especialmente a fin de servir de apoyo para los brazos y el beb mientras amamanta.  Asegrese de que la barriga del beb (abdomen) est frente a la suya.  Masajee suavemente la mama. Con las yemas de los dedos, masajee los bordes exteriores de la mama hacia adentro, en direccin al pezn. Esto estimula el flujo de leche. Si la leche fluye lentamente, es posible que deba continuar con este movimiento durante la lactancia.  Sostenga la mama con 4 dedos por debajo y el pulgar por arriba del pezn (forme la letra "C" con la mano). Asegrese de que los dedos se encuentren lejos del pezn y de la boca del beb.  Empuje suavemente los labios del beb con el pezn o con el dedo.  Cuando la boca del beb se abra lo suficiente, acrquelo rpidamente a la mama e introduzca todo el pezn y la arola, tanto como sea posible, dentro de la boca del beb. La arola es la zona de color que rodea al pezn. ? Debe haber ms arola visible por arriba del labio superior del beb que por debajo del labio inferior. ? Los labios del beb deben estar abiertos y extendidos hacia afuera (evertidos) para asegurar   que el beb se prenda de forma adecuada y cmoda. ? La lengua del beb debe estar entre la enca inferior y la mama.  Asegrese de que la boca del beb est en la posicin correcta alrededor del pezn (prendido). Los labios del beb deben crear un sello sobre la mama y estar doblados hacia afuera (invertidos).  Es comn que el beb succione durante 2 a 3 minutos  para que comience el flujo de leche materna. Cmo debe prenderse Es muy importante que le ensee al beb cmo prenderse adecuadamente a la mama. Si el beb no se prende adecuadamente, puede causar dolor en los pezones, reducir la produccin de leche materna y hacer que el beb tenga un escaso aumento de peso. Adems, si el beb no se prende adecuadamente al pezn, puede tragar aire durante la alimentacin. Esto puede causarle molestias al beb. Hacer eructar al beb al cambiar de mama puede ayudarlo a liberar el aire. Sin embargo, ensearle al beb cmo prenderse a la mama adecuadamente es la mejor manera de evitar que se sienta molesto por tragar aire mientras se alimenta. Signos de que el beb se ha prendido adecuadamente al pezn  Tironea o succiona de modo silencioso, sin causarle dolor. Los labios del beb deben estar extendidos hacia afuera (evertidos).  Se escucha que traga cada 3 o 4 succiones una vez que la leche ha comenzado a fluir (despus de que se produzca el reflejo de eyeccin de la leche).  Hay movimientos musculares por arriba y por delante de sus odos al succionar. Signos de que el beb no se ha prendido adecuadamente al pezn  Hace ruidos de succin o de chasquido mientras se alimenta.  Siente dolor en los pezones. Si cree que el beb no se prendi correctamente, deslice el dedo en la comisura de la boca y colquelo entre las encas del beb para interrumpir la succin. Intente volver a comenzar a amamantar. Signos de lactancia materna exitosa Signos del beb  El beb disminuir gradualmente el nmero de succiones o dejar de succionar por completo.  El beb se quedar dormido.  El cuerpo del beb se relajar.  El beb retendr una pequea cantidad de leche en la boca.  El beb se desprender solo del pecho. Signos que presenta usted  Las mamas han aumentado la firmeza, el peso y el tamao 1 a 3 horas despus de amamantar.  Estn ms blandas inmediatamente despus  de amamantar.  Se producen un aumento del volumen de leche y un cambio en su consistencia y color hacia el quinto da de lactancia.  Los pezones no duelen, no estn agrietados ni sangran. Signos de que su beb recibe la cantidad de leche suficiente  Mojar por lo menos 1 o 2paales durante las primeras 24horas despus del nacimiento.  Mojar por lo menos 5 o 6paales cada 24horas durante la primera semana despus del nacimiento. La orina debe ser clara o de color amarillo plido a los 5das de vida.  Mojar entre 6 y 8paales cada 24horas a medida que el beb sigue creciendo y desarrollndose.  Defeca por lo menos 3 veces en 24 horas a los 5 das de vida. Las heces deben ser blandas y amarillentas.  Defeca por lo menos 3 veces en 24 horas a los 7 das de vida. Las heces deben ser grumosas y amarillentas.  No registra una prdida de peso mayor al 10% del peso al nacer durante los primeros 3 das de vida.  Aumenta de peso un promedio de 4   a 7onzas (113 a 198g) por semana despus de los 4 das de vida.  Aumenta de peso, diariamente, de manera uniforme a partir de los 5 das de vida, sin registrar prdida de peso despus de las 2semanas de vida. Despus de alimentarse, es posible que el beb regurgite una pequea cantidad de leche. Esto es normal. Frecuencia y duracin de la lactancia El amamantamiento frecuente la ayudar a producir ms leche y puede prevenir dolores en los pezones y las mamas extremadamente llenas (congestin mamaria). Alimente al beb cuando muestre signos de hambre o si siente la necesidad de reducir la congestin de las mamas. Esto se denomina "lactancia a demanda". Las seales de que el beb tiene hambre incluyen las siguientes:  Aumento del estado de alerta, actividad o inquietud.  Mueve la cabeza de un lado a otro.  Abre la boca cuando se le toca la mejilla o la comisura de la boca (reflejo de bsqueda).  Aumenta las vocalizaciones, tales como sonidos de  succin, se relame los labios, emite arrullos, suspiros o chirridos.  Mueve la mano hacia la boca y se chupa los dedos o las manos.  Est molesto o llora. Evite el uso del chupete en las primeras 4 a 6 semanas despus del nacimiento del beb. Despus de este perodo, podr usar un chupete. Las investigaciones demostraron que el uso del chupete durante el primer ao de vida del beb disminuye el riesgo de tener el sndrome de muerte sbita del lactante (SMSL). Permita que el nio se alimente en cada mama todo lo que desee. Cuando el beb se desprende o se queda dormido mientras se est alimentando de la primera mama, ofrzcale la segunda. Debido a que, con frecuencia, los recin nacidos estn somnolientos las primeras semanas de vida, es posible que deba despertar al beb para alimentarlo. Los horarios de lactancia varan de un beb a otro. Sin embargo, las siguientes reglas pueden servir como gua para ayudarla a garantizar que el beb se alimenta adecuadamente:  Se puede amamantar a los recin nacidos (bebs de 4 semanas o menos de vida) cada 1 a 3 horas.  No deben transcurrir ms de 3 horas durante el da o 5 horas durante la noche sin que se amamante a los recin nacidos.  Debe amamantar al beb un mnimo de 8 veces en un perodo de 24 horas. Extraccin de leche materna     La extraccin y el almacenamiento de la leche materna le permiten asegurarse de que el beb se alimente exclusivamente de su leche materna, aun en momentos en los que no puede amamantar. Esto tiene especial importancia si debe regresar al trabajo en el perodo en que an est amamantando o si no puede estar presente en los momentos en que el beb debe alimentarse. Su asesor en lactancia puede ayudarla a encontrar un mtodo de extraccin que funcione mejor para usted y orientarla sobre cunto tiempo es seguro almacenar leche materna. Cmo cuidar las mamas durante la lactancia Los pezones pueden secarse, agrietarse y doler  durante la lactancia. Las siguientes recomendaciones pueden ayudarla a mantener las mamas humectadas y sanas:  Evite usar jabn en los pezones.  Use un sostn de soporte diseado especialmente para la lactancia materna. Evite usar sostenes con aro o sostenes muy ajustados (sostenes deportivos).  Seque al aire sus pezones durante 3 a 4minutos despus de amamantar al beb.  Utilice solo apsitos de algodn en el sostn para absorber las prdidas de leche. La prdida de un poco de leche materna entre   las tomas es normal.  Utilice lanolina sobre los pezones luego de amamantar. La lanolina ayuda a mantener la humedad normal de la piel. La lanolina pura no es perjudicial (no es txica) para el beb. Adems, puede extraer manualmente algunas gotas de leche materna y masajear suavemente esa leche sobre los pezones para que la leche se seque al aire. Durante las primeras semanas despus del nacimiento, algunas mujeres experimentan congestin mamaria. La congestin mamaria puede hacer que sienta las mamas pesadas, calientes y sensibles al tacto. El pico de la congestin mamaria ocurre en el plazo de los 3 a 5 das despus del parto. Las siguientes recomendaciones pueden ayudarla a aliviar la congestin mamaria:  Vace por completo las mamas al amamantar o extraer leche. Puede aplicar calor hmedo en las mamas (en la ducha o con toallas hmedas para manos) antes de amamantar o extraer leche. Esto aumenta la circulacin y ayuda a que la leche fluya. Si el beb no vaca por completo las mamas cuando lo amamanta, extraiga la leche restante despus de que haya finalizado.  Aplique compresas de hielo sobre las mamas inmediatamente despus de amamantar o extraer leche, a menos que le resulte demasiado incmodo. Haga lo siguiente: ? Ponga el hielo en una bolsa plstica. ? Coloque una toalla entre la piel y la bolsa de hielo. ? Coloque el hielo durante 20minutos, 2 o 3veces por da.  Asegrese de que el beb  est prendido y se encuentre en la posicin correcta mientras lo alimenta. Si la congestin mamaria persiste luego de 48 horas o despus de seguir estas recomendaciones, comunquese con su mdico o un asesor en lactancia. Recomendaciones de salud general durante la lactancia  Consuma 3 comidas y 3 colaciones saludables todos los das. Las madres bien alimentadas que amamantan necesitan entre 450 y 500 caloras adicionales por da. Puede cumplir con este requisito al aumentar la cantidad de una dieta equilibrada que realice.  Beba suficiente agua para mantener la orina clara o de color amarillo plido.  Descanse con frecuencia, reljese y siga tomando sus vitaminas prenatales para prevenir la fatiga, el estrs y los niveles bajos de vitaminas y minerales en el cuerpo (deficiencias de nutrientes).  No consuma ningn producto que contenga nicotina o tabaco, como cigarrillos y cigarrillos electrnicos. El beb puede verse afectado por las sustancias qumicas de los cigarrillos que pasan a la leche materna y por la exposicin al humo ambiental del tabaco. Si necesita ayuda para dejar de fumar, consulte al mdico.  Evite el consumo de alcohol.  No consuma drogas ilegales o marihuana.  Antes de usar cualquier medicamento, hable con el mdico. Estos incluyen medicamentos recetados y de venta libre, como tambin vitaminas y suplementos a base de hierbas. Algunos medicamentos, que pueden ser perjudiciales para el beb, pueden pasar a travs de la leche materna.  Puede quedar embarazada durante la lactancia. Si se desea un mtodo anticonceptivo, consulte al mdico sobre cules son las opciones seguras durante la lactancia. Dnde encontrar ms informacin: Liga internacional La Leche: www.llli.org. Comunquese con un mdico si:  Siente que quiere dejar de amamantar o se siente frustrada con la lactancia.  Sus pezones estn agrietados o sangran.  Sus mamas estn irritadas, sensibles o  calientes.  Tiene los siguientes sntomas: ? Dolor en las mamas o en los pezones. ? Un rea hinchada en cualquiera de las mamas. ? Fiebre o escalofros. ? Nuseas o vmitos. ? Drenaje de otro lquido distinto de la leche materna desde los pezones.  Sus mamas no   se llenan antes de amamantar al beb para el quinto da despus del parto.  Se siente triste y deprimida.  El beb: ? Est demasiado somnoliento como para comer bien. ? Tiene problemas para dormir. ? Tiene ms de 1 semana de vida y moja menos de 6 paales en un periodo de 24 horas. ? No ha aumentado de peso a los 5 das de vida.  El beb defeca menos de 3 veces en 24 horas.  La piel del beb o las partes blancas de los ojos se vuelven amarillentas. Solicite ayuda de inmediato si:  El beb est muy cansado (letargo) y no se quiere despertar para comer.  Le sube la fiebre sin causa. Resumen  La lactancia materna ofrece muchos beneficios de salud para bebs y madres.  Intente amamantar a su beb cuando muestre signos tempranos de hambre.  Haga cosquillas o empuje suavemente los labios del beb con el dedo o el pezn para lograr que el beb abra la boca. Acerque el beb a la mama. Asegrese de que la mayor parte de la arola se encuentre dentro de la boca del beb. Ofrzcale una mama y haga eructar al beb antes de pasar a la otra.  Hable con su mdico o asesor en lactancia si tiene dudas o problemas con la lactancia. Esta informacin no tiene como fin reemplazar el consejo del mdico. Asegrese de hacerle al mdico cualquier pregunta que tenga. Document Revised: 11/26/2017 Document Reviewed: 12/22/2016 Elsevier Patient Education  2020 Elsevier Inc.  

## 2019-10-27 NOTE — Progress Notes (Signed)
   PRENATAL VISIT NOTE  Subjective:  Taylor Lopez is a 39 y.o. [id number] at [redacted]w[redacted]d being seen today for ongoing prenatal care.  She is currently monitored for the following issues for this high-risk pregnancy and has Antepartum transient hypertension; Antepartum multigravida of advanced maternal age; Supervision of high risk pregnancy, antepartum; Language barrier; and Breast pain, left on their problem list.  Patient reports no complaints.  Contractions: Irritability. Vag. Bleeding: None.  Movement: Present. Denies leaking of fluid.   The following portions of the patient's history were reviewed and updated as appropriate: allergies, current medications, past family history, past medical history, past social history, past surgical history and problem list.   Objective:   Vitals:   10/27/19 0905  BP: 137/81  Pulse: 99  Weight: 202 lb (91.6 kg)    Fetal Status: Fetal Heart Rate (bpm): 131 Fundal Height: 40 cm Movement: Present  Presentation: Vertex  General:  Alert, oriented and cooperative. Patient is in no acute distress.  Skin: Skin is warm and dry. No rash noted.   Cardiovascular: Normal heart rate noted  Respiratory: Normal respiratory effort, no problems with respiration noted  Abdomen: Soft, gravid, appropriate for gestational age.  Pain/Pressure: Present     Pelvic: Cervical exam performed Dilation: 1.5 Effacement (%): 50 Station: -2  Extremities: Normal range of motion.  Edema: None  Mental Status: Normal mood and affect. Normal behavior. Normal judgment and thought content.   Assessment and Plan:  Pregnancy: [id number] at [redacted]w[redacted]d 1. Antepartum multigravida of advanced maternal age On ASA  2. Supervision of high risk pregnancy, antepartum Continue prenatal care. Offered her IOL at 43 wks, given age, and rising FH--she declines   3. Language barrier Spanish interpreter: in person used   Preterm labor symptoms and general obstetric precautions including but not limited  to vaginal bleeding, contractions, leaking of fluid and fetal movement were reviewed in detail with the patient. Please refer to After Visit Summary for other counseling recommendations.   Return in 1 week (on 11/03/2019) for in person.  Future Appointments  Date Time Provider Department Center  11/02/2019  1:00 PM [place], [place] [facility]    Reva Bores, MD

## 2019-11-02 ENCOUNTER — Encounter: Payer: Self-pay | Admitting: Advanced Practice Midwife

## 2019-11-08 ENCOUNTER — Telehealth (HOSPITAL_COMMUNITY): Payer: Self-pay | Admitting: *Deleted

## 2019-11-08 ENCOUNTER — Ambulatory Visit (INDEPENDENT_AMBULATORY_CARE_PROVIDER_SITE_OTHER): Payer: Self-pay | Admitting: Obstetrics and Gynecology

## 2019-11-08 ENCOUNTER — Other Ambulatory Visit: Payer: Self-pay | Admitting: Advanced Practice Midwife

## 2019-11-08 ENCOUNTER — Other Ambulatory Visit: Payer: Self-pay

## 2019-11-08 VITALS — BP 139/83 | HR 116 | Wt 208.8 lb

## 2019-11-08 DIAGNOSIS — Z3A39 39 weeks gestation of pregnancy: Secondary | ICD-10-CM

## 2019-11-08 DIAGNOSIS — Z789 Other specified health status: Secondary | ICD-10-CM

## 2019-11-08 DIAGNOSIS — O09529 Supervision of elderly multigravida, unspecified trimester: Secondary | ICD-10-CM

## 2019-11-08 DIAGNOSIS — O0993 Supervision of high risk pregnancy, unspecified, third trimester: Secondary | ICD-10-CM

## 2019-11-08 DIAGNOSIS — O09523 Supervision of elderly multigravida, third trimester: Secondary | ICD-10-CM

## 2019-11-08 DIAGNOSIS — O0990 Supervision of high risk pregnancy, unspecified, unspecified trimester: Secondary | ICD-10-CM

## 2019-11-08 NOTE — Progress Notes (Signed)
Prenatal Visit Note Date: 11/08/2019 Clinic: [facility]  Subjective:  Taylor Lopez is a 39 y.o. [id number] at [redacted]w[redacted]d being seen today for ongoing prenatal care.  She is currently monitored for the following issues for this high-risk pregnancy and has Antepartum transient hypertension; Antepartum multigravida of advanced maternal age; Supervision of high risk pregnancy, antepartum; Language barrier; and Breast pain, left on their problem list.  Patient reports no complaints.   Contractions: Irritability. Vag. Bleeding: None.  Movement: Present. Denies leaking of fluid.   The following portions of the patient's history were reviewed and updated as appropriate: allergies, current medications, past family history, past medical history, past social history, past surgical history and problem list. Problem list updated.  Objective:   Vitals:   11/08/19 1411  BP: 139/83  Pulse: (!) 116  Weight: 208 lb 12.8 oz (94.7 kg)    Fetal Status: Fetal Heart Rate (bpm): 156  Fundal Height: 40 cm Movement: Present  Presentation: Vertex  General:  Alert, oriented and cooperative. Patient is in no acute distress.  Skin: Skin is warm and dry. No rash noted.   Cardiovascular: Normal heart rate noted  Respiratory: Normal respiratory effort, no problems with respiration noted  Abdomen: Soft, gravid, appropriate for gestational age. Pain/Pressure: Present     Pelvic:  Cervical exam performed Dilation: 1 Effacement (%): 50 Station: -3  Extremities: Normal range of motion.  Edema: Trace  Mental Status: Normal mood and affect. Normal behavior. Normal judgment and thought content.   Urinalysis:      Assessment and Plan:  Pregnancy: [id number] at [redacted]w[redacted]d  1. Antepartum multigravida of advanced maternal age  47. Supervision of high risk pregnancy, antepartum Routine care. Pt would like to go into labor naturally but is okay with IOL next week. She is amenable to an NST for post dates  testing.  3. Language barrier Interpreter used  Preterm labor symptoms and general obstetric precautions including but not limited to vaginal bleeding, contractions, leaking of fluid and fetal movement were reviewed in detail with the patient. Please refer to After Visit Summary for other counseling recommendations.  Return in about 3 days (around 11/11/2019) for nst, bp and weight.   Maunawili Bing, MD

## 2019-11-09 NOTE — Telephone Encounter (Signed)
Preadmission screen  

## 2019-11-10 ENCOUNTER — Telehealth (HOSPITAL_COMMUNITY): Payer: Self-pay | Admitting: *Deleted

## 2019-11-10 ENCOUNTER — Encounter (HOSPITAL_COMMUNITY): Payer: Self-pay | Admitting: *Deleted

## 2019-11-10 NOTE — Telephone Encounter (Signed)
Interpreter number [phone]

## 2019-11-11 ENCOUNTER — Other Ambulatory Visit: Payer: Self-pay

## 2019-11-11 ENCOUNTER — Ambulatory Visit (INDEPENDENT_AMBULATORY_CARE_PROVIDER_SITE_OTHER): Payer: Self-pay | Admitting: *Deleted

## 2019-11-11 VITALS — BP 132/83 | HR 88 | Wt 208.0 lb

## 2019-11-11 DIAGNOSIS — O480 Post-term pregnancy: Secondary | ICD-10-CM

## 2019-11-11 DIAGNOSIS — O0990 Supervision of high risk pregnancy, unspecified, unspecified trimester: Secondary | ICD-10-CM

## 2019-11-11 DIAGNOSIS — O09523 Supervision of elderly multigravida, third trimester: Secondary | ICD-10-CM

## 2019-11-12 ENCOUNTER — Other Ambulatory Visit (HOSPITAL_COMMUNITY)
Admission: RE | Admit: 2019-11-12 | Discharge: 2019-11-12 | Disposition: A | Discharge status: Home / Self Care | Payer: HRSA Program | Source: Ambulatory Visit | Attending: Family Medicine | Admitting: Family Medicine

## 2019-11-12 DIAGNOSIS — Z20822 Contact with and (suspected) exposure to covid-19: Secondary | ICD-10-CM | POA: Insufficient documentation

## 2019-11-12 DIAGNOSIS — Z01812 Encounter for preprocedural laboratory examination: Secondary | ICD-10-CM | POA: Diagnosis present

## 2019-11-12 LAB — SARS CORONAVIRUS 2 (TAT 6-24 HRS): SARS Coronavirus 2: NEGATIVE

## 2019-11-15 ENCOUNTER — Inpatient Hospital Stay (HOSPITAL_COMMUNITY): Payer: Medicaid Other

## 2019-11-15 ENCOUNTER — Encounter (HOSPITAL_COMMUNITY): Payer: Self-pay | Admitting: Obstetrics and Gynecology

## 2019-11-15 ENCOUNTER — Other Ambulatory Visit: Payer: Self-pay

## 2019-11-15 ENCOUNTER — Inpatient Hospital Stay (HOSPITAL_COMMUNITY): Payer: Medicaid Other | Admitting: Anesthesiology

## 2019-11-15 ENCOUNTER — Inpatient Hospital Stay (HOSPITAL_COMMUNITY)
Admission: AD | Admit: 2019-11-15 | Discharge: 2019-11-16 | DRG: 807 | Disposition: A | Discharge status: Home / Self Care | Payer: Medicaid Other | Attending: Obstetrics and Gynecology | Admitting: Obstetrics and Gynecology

## 2019-11-15 DIAGNOSIS — O09529 Supervision of elderly multigravida, unspecified trimester: Secondary | ICD-10-CM

## 2019-11-15 DIAGNOSIS — Z3A40 40 weeks gestation of pregnancy: Secondary | ICD-10-CM

## 2019-11-15 DIAGNOSIS — E669 Obesity, unspecified: Secondary | ICD-10-CM | POA: Diagnosis present

## 2019-11-15 DIAGNOSIS — L918 Other hypertrophic disorders of the skin: Secondary | ICD-10-CM | POA: Diagnosis present

## 2019-11-15 DIAGNOSIS — O134 Gestational [pregnancy-induced] hypertension without significant proteinuria, complicating childbirth: Secondary | ICD-10-CM | POA: Diagnosis present

## 2019-11-15 DIAGNOSIS — O133 Gestational [pregnancy-induced] hypertension without significant proteinuria, third trimester: Secondary | ICD-10-CM

## 2019-11-15 DIAGNOSIS — O99214 Obesity complicating childbirth: Secondary | ICD-10-CM | POA: Diagnosis present

## 2019-11-15 DIAGNOSIS — Z789 Other specified health status: Secondary | ICD-10-CM | POA: Diagnosis present

## 2019-11-15 DIAGNOSIS — O480 Post-term pregnancy: Principal | ICD-10-CM | POA: Diagnosis present

## 2019-11-15 DIAGNOSIS — O139 Gestational [pregnancy-induced] hypertension without significant proteinuria, unspecified trimester: Secondary | ICD-10-CM | POA: Diagnosis present

## 2019-11-15 HISTORY — DX: Supervision of pregnancy with insufficient antenatal care, unspecified trimester: O09.30

## 2019-11-15 LAB — COMPREHENSIVE METABOLIC PANEL
ALT: 24 U/L (ref 0–44)
AST: 26 U/L (ref 15–41)
Albumin: 2.5 g/dL — ABNORMAL LOW (ref 3.5–5.0)
Alkaline Phosphatase: 233 U/L — ABNORMAL HIGH (ref 38–126)
Anion gap: 11 (ref 5–15)
BUN: 12 mg/dL (ref 6–20)
CO2: 21 mmol/L — ABNORMAL LOW (ref 22–32)
Calcium: 9 mg/dL (ref 8.9–10.3)
Chloride: 104 mmol/L (ref 98–111)
Creatinine, Ser: 0.62 mg/dL (ref 0.44–1.00)
GFR calc Af Amer: >60 mL/min (ref >60)
GFR calc non Af Amer: >60 mL/min (ref >60)
Glucose, Bld: 111 mg/dL — ABNORMAL HIGH (ref 70–99)
Potassium: 4.1 mmol/L (ref 3.5–5.1)
Sodium: 136 mmol/L (ref 135–145)
Total Bilirubin: 0.4 mg/dL (ref 0.3–1.2)
Total Protein: 5.6 g/dL — ABNORMAL LOW (ref 6.5–8.1)

## 2019-11-15 LAB — TYPE AND SCREEN
ABO/RH(D): O POS
Antibody Screen: NEGATIVE

## 2019-11-15 LAB — CBC
HCT: 41.3 % (ref 36.0–46.0)
Hemoglobin: 13.8 g/dL (ref 12.0–15.0)
MCH: 29.4 pg (ref 26.0–34.0)
MCHC: 33.4 g/dL (ref 30.0–36.0)
MCV: 88.1 fL (ref 80.0–100.0)
Platelets: 219 10*3/uL (ref 150–400)
RBC: 4.69 MIL/uL (ref 3.87–5.11)
RDW: 15.7 % — ABNORMAL HIGH (ref 11.5–15.5)
WBC: 7.2 10*3/uL (ref 4.0–10.5)
nRBC: 0 % (ref 0.0–0.2)

## 2019-11-15 LAB — RPR: RPR Ser Ql: NONREACTIVE

## 2019-11-15 LAB — PROTEIN / CREATININE RATIO, URINE
Creatinine, Urine: 97.86 mg/dL
Protein Creatinine Ratio: 0.29 mg/mg{Cre} — ABNORMAL HIGH (ref 0.00–0.15)
Total Protein, Urine: 28 mg/dL

## 2019-11-15 LAB — ABO/RH: ABO/RH(D): O POS

## 2019-11-15 MED ORDER — SIMETHICONE 80 MG PO CHEW: 80.0000 mg | CHEWABLE_TABLET | ORAL | Status: DC | PRN

## 2019-11-15 MED ORDER — OXYTOCIN 40 UNITS IN NORMAL SALINE INFUSION - SIMPLE MED
1.0000 m[IU]/min | INTRAVENOUS | Status: DC
  Administered 2019-11-15: 2 m[IU]/min via INTRAVENOUS
  Filled 2019-11-15: qty 1000

## 2019-11-15 MED ORDER — PHENYLEPHRINE 40 MCG/ML (10ML) SYRINGE FOR IV PUSH (FOR BLOOD PRESSURE SUPPORT): 80.0000 ug | PREFILLED_SYRINGE | INTRAVENOUS | Status: DC | PRN

## 2019-11-15 MED ORDER — TERBUTALINE SULFATE 1 MG/ML IJ SOLN: 0.2500 mg | Freq: Once | INTRAMUSCULAR | Status: DC | PRN

## 2019-11-15 MED ORDER — MEASLES, MUMPS & RUBELLA VAC IJ SOLR: 0.5000 mL | Freq: Once | INTRAMUSCULAR | Status: DC

## 2019-11-15 MED ORDER — EPHEDRINE 5 MG/ML INJ: 10.0000 mg | INTRAVENOUS | Status: DC | PRN

## 2019-11-15 MED ORDER — LIDOCAINE HCL (PF) 1 % IJ SOLN
INTRAMUSCULAR | Status: DC | PRN
  Administered 2019-11-15: 2 mL via EPIDURAL
  Administered 2019-11-15: 10 mL via EPIDURAL

## 2019-11-15 MED ORDER — PRENATAL MULTIVITAMIN CH
1.0000 | ORAL_TABLET | Freq: Every day | ORAL | Status: DC
  Administered 2019-11-15 – 2019-11-16 (×2): 1 via ORAL
  Filled 2019-11-15 (×2): qty 1

## 2019-11-15 MED ORDER — ACETAMINOPHEN 325 MG PO TABS: 650.0000 mg | ORAL_TABLET | ORAL | Status: DC | PRN

## 2019-11-15 MED ORDER — LACTATED RINGERS IV SOLN: INTRAVENOUS | Status: DC

## 2019-11-15 MED ORDER — SENNOSIDES-DOCUSATE SODIUM 8.6-50 MG PO TABS
2.0000 | ORAL_TABLET | ORAL | Status: DC
  Administered 2019-11-15: 2 via ORAL
  Filled 2019-11-15: qty 2

## 2019-11-15 MED ORDER — OXYTOCIN 40 UNITS IN NORMAL SALINE INFUSION - SIMPLE MED: 2.5000 [IU]/h | INTRAVENOUS | Status: DC

## 2019-11-15 MED ORDER — DIPHENHYDRAMINE HCL 25 MG PO CAPS: 25.0000 mg | ORAL_CAPSULE | Freq: Four times a day (QID) | ORAL | Status: DC | PRN

## 2019-11-15 MED ORDER — ACETAMINOPHEN 325 MG PO TABS
650.0000 mg | ORAL_TABLET | Freq: Four times a day (QID) | ORAL | Status: DC | PRN
  Administered 2019-11-15: 650 mg via ORAL
  Filled 2019-11-15 (×2): qty 2

## 2019-11-15 MED ORDER — OXYTOCIN BOLUS FROM INFUSION
500.0000 mL | Freq: Once | INTRAVENOUS | Status: AC
  Administered 2019-11-15: 500 mL via INTRAVENOUS

## 2019-11-15 MED ORDER — DIBUCAINE (PERIANAL) 1 % EX OINT: 1.0000 "application " | TOPICAL_OINTMENT | CUTANEOUS | Status: DC | PRN

## 2019-11-15 MED ORDER — COCONUT OIL OIL: 1.0000 "application " | TOPICAL_OIL | Status: DC | PRN

## 2019-11-15 MED ORDER — MISOPROSTOL 25 MCG QUARTER TABLET
25.0000 ug | ORAL_TABLET | ORAL | Status: DC | PRN
  Filled 2019-11-15: qty 1

## 2019-11-15 MED ORDER — FENTANYL CITRATE (PF) 100 MCG/2ML IJ SOLN: 50.0000 ug | INTRAMUSCULAR | Status: DC | PRN

## 2019-11-15 MED ORDER — ONDANSETRON HCL 4 MG/2ML IJ SOLN: 4.0000 mg | INTRAMUSCULAR | Status: DC | PRN

## 2019-11-15 MED ORDER — BENZOCAINE-MENTHOL 20-0.5 % EX AERO: 1.0000 "application " | INHALATION_SPRAY | CUTANEOUS | Status: DC | PRN

## 2019-11-15 MED ORDER — ONDANSETRON HCL 4 MG PO TABS: 4.0000 mg | ORAL_TABLET | ORAL | Status: DC | PRN

## 2019-11-15 MED ORDER — IBUPROFEN 600 MG PO TABS
600.0000 mg | ORAL_TABLET | Freq: Three times a day (TID) | ORAL | Status: DC | PRN
  Administered 2019-11-15 – 2019-11-16 (×3): 600 mg via ORAL
  Filled 2019-11-15 (×3): qty 1

## 2019-11-15 MED ORDER — TRANEXAMIC ACID-NACL 1000-0.7 MG/100ML-% IV SOLN
1000.0000 mg | INTRAVENOUS | Status: AC
  Administered 2019-11-15: 1000 mg via INTRAVENOUS
  Filled 2019-11-15: qty 100

## 2019-11-15 MED ORDER — NIFEDIPINE ER OSMOTIC RELEASE 30 MG PO TB24
30.0000 mg | ORAL_TABLET | Freq: Every day | ORAL | Status: DC
  Administered 2019-11-15 – 2019-11-16 (×2): 30 mg via ORAL
  Filled 2019-11-15 (×2): qty 1

## 2019-11-15 MED ORDER — ONDANSETRON HCL 4 MG/2ML IJ SOLN: 4.0000 mg | Freq: Four times a day (QID) | INTRAMUSCULAR | Status: DC | PRN

## 2019-11-15 MED ORDER — FENTANYL-BUPIVACAINE-NACL 0.5-0.125-0.9 MG/250ML-% EP SOLN
12.0000 mL/h | EPIDURAL | Status: DC | PRN
  Filled 2019-11-15: qty 250

## 2019-11-15 MED ORDER — LACTATED RINGERS IV SOLN: 500.0000 mL | Freq: Once | INTRAVENOUS | Status: DC

## 2019-11-15 MED ORDER — SODIUM CHLORIDE (PF) 0.9 % IJ SOLN
INTRAMUSCULAR | Status: DC | PRN
  Administered 2019-11-15: 12 mL/h via EPIDURAL

## 2019-11-15 MED ORDER — SOD CITRATE-CITRIC ACID 500-334 MG/5ML PO SOLN: 30.0000 mL | ORAL | Status: DC | PRN

## 2019-11-15 MED ORDER — WITCH HAZEL-GLYCERIN EX PADS: 1.0000 "application " | MEDICATED_PAD | CUTANEOUS | Status: DC | PRN

## 2019-11-15 MED ORDER — TETANUS-DIPHTH-ACELL PERTUSSIS 5-2.5-18.5 LF-MCG/0.5 IM SUSP: 0.5000 mL | Freq: Once | INTRAMUSCULAR | Status: DC

## 2019-11-15 MED ORDER — DIPHENHYDRAMINE HCL 50 MG/ML IJ SOLN: 12.5000 mg | INTRAMUSCULAR | Status: DC | PRN

## 2019-11-15 MED ORDER — PHENYLEPHRINE 40 MCG/ML (10ML) SYRINGE FOR IV PUSH (FOR BLOOD PRESSURE SUPPORT)
80.0000 ug | PREFILLED_SYRINGE | INTRAVENOUS | Status: DC | PRN
  Filled 2019-11-15: qty 10

## 2019-11-15 MED ORDER — LIDOCAINE HCL (PF) 1 % IJ SOLN
30.0000 mL | INTRAMUSCULAR | Status: AC | PRN
  Administered 2019-11-15: 30 mL via SUBCUTANEOUS
  Filled 2019-11-15: qty 30

## 2019-11-15 MED ORDER — LACTATED RINGERS IV SOLN: 500.0000 mL | INTRAVENOUS | Status: DC | PRN

## 2019-11-15 NOTE — Progress Notes (Signed)
Labor Progress Note Taylor Lopez is a 39 y.o. [id number] at [redacted]w[redacted]d presented for PD IOL. S: Feeling strong ctx. Desires epidural.   O:  BP (!) 144/94   Pulse (!) 123   Temp 98.2 F (36.8 C) (Oral)   Resp 18   Ht 5\' 3"  (1.6 m)   Wt 95.3 kg   LMP  (LMP Unknown)   SpO2 99%   BMI 37.20 kg/m  EFM: 145, moderate variability, pos accels, early decels, reactive TOCO: q80m  CVE: Dilation: 6 Effacement (%): 70 Station: -1 Presentation: Vertex Exam by:: Dr. [email or web address]   A&P: 39 y.o. 24 [redacted]w[redacted]d here for PD IOL. #Labor: Progressing well. S/p FB and AROM. Cont Pit. Anticipate SVD. No hx of PPH but due to multip status and rapid labor; will give TXA prior to delivery. #Pain: per patient request  #FWB: Cat I #GBS negative #elevated BP's on admit: One severe-range SBP but otherwise mild-range not yet 4 hours apart. Asymptomatic. PCR pending. CMP WNL.  [redacted]w[redacted]d, MD 6:03 AM

## 2019-11-15 NOTE — Progress Notes (Signed)
Labor Progress Note Taylor Lopez is a 39 y.o. [id number] at [redacted]w[redacted]d presented for PD IOL. S: Feeling strong ctx. Declines pain medication.  O:  BP (!) 144/78   Pulse (!) 102   Temp 98 F (36.7 C) (Oral)   Resp 18   Ht 5\' 3"  (1.6 m)   Wt 95.3 kg   LMP  (LMP Unknown)   SpO2 99%   BMI 37.20 kg/m  EFM: 150, moderate variability, pos accels, no decels, reactive TOCO: q38m  CVE: Dilation: 5.5 Effacement (%): 50 Station: -2 Presentation: Vertex Exam by:: Dr. [email or web address]   A&P: 39 y.o. 24 [redacted]w[redacted]d here for PD IOL. #Labor: Progressing well. S/p FB. Cont Pit. AROM with clear/bloody fluid at this exam. Anticipate SVD soon. No hx of PPH but due to multip status and rapid labor; will give TXA prior to delivery. #Pain: per patient request  #FWB: Cat I #GBS negative #elevated BP's on admit: One severe-range SBP but otherwise mild-range not yet 4 hours apart. Asymptomatic. PCR pending. CMP WNL.  [redacted]w[redacted]d, MD 2:13 AM

## 2019-11-15 NOTE — Discharge Summary (Addendum)
Postpartum Discharge Summary  Date of Service updated3/3/21     Patient Name: Taylor Lopez DOB: [birth year] MRN: [id number]  Date of admission: 11/15/2019 Delivering Provider: Chauncey Mann   Date of discharge: 11/16/2019  Admitting diagnosis: Advanced maternal age in multigravida [O09.529] Intrauterine pregnancy: [redacted]w[redacted]d    Secondary diagnosis:  Active Problems:   Antepartum multigravida of advanced maternal age   Language barrier   Labor and delivery, indication for care   [redacted] weeks gestation of pregnancy   Gestational hypertension  Additional problems: None     Discharge diagnosis: Term Pregnancy Delivered                                                                                                Post partum procedures:None  Augmentation: AROM, Pitocin and Foley Balloon  Complications: None  Hospital course:  Induction of Labor With Vaginal Delivery   39y.o. yo [id number] admitted to the hospital 11/15/2019 for induction of labor.  Indication for induction: Postdates.  Patient had an uncomplicated labor course as follows: Initial SVE: 2.5/thick/-3. Patient received Foley balloon, Pitocin, AROM and epidural. She then progressed to complete.  Membrane Rupture Time/Date: 2:09 AM ,11/15/2019   Intrapartum Procedures: Episiotomy: None [1]                                         Lacerations:  1st degree [2]  Patient had delivery of a Viable infant.  Information for the patient's newborn:  DiNichoel, Digiulio0[[id number]]Delivery Method: Vaginal, Spontaneous(Filed from Delivery Summary)    11/15/2019  Details of delivery can be found in separate delivery note. Elevated BP's noted intrapartum, CMP WNL, PCR= 0.29. BP's monitored post-partum . BPs have been in good range following administration of Nifedipine XL.  Planning Nexplanon placement at HD. Patient had a routine postpartum course. Patient is discharged home 11/16/19. Delivery time: 7:11 AM    Magnesium  Sulfate received: No BMZ received: No Rhophylac:No MMR:No Transfusion:No  Physical exam  Vitals:   11/15/19 1410 11/15/19 1852 11/15/19 2310 11/16/19 0540  BP: (!) 143/71 126/69 133/74 125/76  Pulse: (!) 110 92 97 90  Resp: 18 16 18 18   Temp: 98.4 F (36.9 C) 98.3 F (36.8 C) 98.3 F (36.8 C) 98.9 F (37.2 C)  TempSrc: Oral Oral Oral Oral  SpO2: 99% 100%    Weight:      Height:       General: alert, cooperative and no distress Lochia: appropriate Uterine Fundus: firm Incision: N/A DVT Evaluation: No evidence of DVT seen on physical exam. Labs: Lab Results  Component Value Date   WBC 7.2 11/15/2019   HGB 13.8 11/15/2019   HCT 41.3 11/15/2019   MCV 88.1 11/15/2019   PLT 219 11/15/2019   CMP Latest Ref Rng & Units 11/15/2019  Glucose 70 - 99 mg/dL 111(H)  BUN 6 - 20 mg/dL 12  Creatinine 0.44 - 1.00 mg/dL 0.62  Sodium 135 - 145 mmol/L 136  Potassium 3.5 - 5.1 mmol/L 4.1  Chloride 98 - 111 mmol/L 104  CO2 22 - 32 mmol/L 21(L)  Calcium 8.9 - 10.3 mg/dL 9.0  Total Protein 6.5 - 8.1 g/dL 5.6(L)  Total Bilirubin 0.3 - 1.2 mg/dL 0.4  Alkaline Phos 38 - 126 U/L 233(H)  AST 15 - 41 U/L 26  ALT 0 - 44 U/L 24   Edinburgh Score: No flowsheet data found.  Discharge instruction: per After Visit Summary and "Baby and Me Booklet".  After visit meds:  Allergies as of 11/16/2019   No Known Allergies     Medication List    TAKE these medications   ibuprofen 600 MG tablet Commonly known as: ADVIL Take 1 tablet (600 mg total) by mouth every 8 (eight) hours as needed for mild pain.   NIFEdipine 30 MG 24 hr tablet Commonly known as: ADALAT CC Take 1 tablet (30 mg total) by mouth daily.   PrePLUS 27-1 MG Tabs Take 1 tablet by mouth daily.       Diet: routine diet  Activity: Advance as tolerated. Pelvic rest for 6 weeks.   Outpatient follow up:4 weeks as well as a 1 week BP check Follow up Appt:No future appointments. Follow up Visit: [place] [facility]. Schedule an appointment as soon as possible for a visit in 1 week(s).   Specialty: Obstetrics and Gynecology Contact information: [address] [place] [phone]            Please schedule this patient for Postpartum visit in: 4 weeks with the following provider: Any provider Virtual For C/S patients schedule nurse incision check in weeks 2 weeks: no Low risk pregnancy complicated by: HTN Delivery mode:  SVD Anticipated Birth Control:  Nexplanon at HD PP Procedures needed: BP check  Schedule Integrated [place] visit: no   Newborn Data: Live born female  Birth Weight:   APGAR: 38, 9  Newborn Delivery   Birth date/time: 11/15/2019 07:11:00 Delivery type: Vaginal, Spontaneous      Baby Feeding: Breast Disposition:home with mother   11/16/2019 Hansel Feinstein, CNM

## 2019-11-15 NOTE — Anesthesia Preprocedure Evaluation (Signed)
Anesthesia Evaluation  Patient identified by MRN, date of birth, ID band Patient awake    Reviewed: Allergy & Precautions, NPO status , Patient's Chart, lab work & pertinent test results  Airway Mallampati: II  TM Distance: >3 FB Neck ROM: Full    Dental no notable dental hx.    Pulmonary neg pulmonary ROS,    Pulmonary exam normal breath sounds clear to auscultation       Cardiovascular negative cardio ROS Normal cardiovascular exam Rhythm:Regular Rate:Normal     Neuro/Psych negative neurological ROS  negative psych ROS   GI/Hepatic negative GI ROS, Neg liver ROS,   Endo/Other  Obesity BMI 37  Renal/GU negative Renal ROS  negative genitourinary   Musculoskeletal negative musculoskeletal ROS (+)   Abdominal   Peds negative pediatric ROS (+)  Hematology negative hematology ROS (+)   Anesthesia Other Findings Spanish-speaking only  Reproductive/Obstetrics (+) Pregnancy                             Anesthesia Physical Anesthesia Plan  ASA: II and emergent  Anesthesia Plan: Epidural   Post-op Pain Management:    Induction:   PONV Risk Score and Plan: 2  Airway Management Planned: Natural Airway  Additional Equipment: None  Intra-op Plan:   Post-operative Plan:   Informed Consent: I have reviewed the patients History and Physical, chart, labs and discussed the procedure including the risks, benefits and alternatives for the proposed anesthesia with the patient or authorized representative who has indicated his/her understanding and acceptance.       Plan Discussed with:   Anesthesia Plan Comments:         Anesthesia Quick Evaluation

## 2019-11-15 NOTE — H&P (Signed)
OBSTETRIC ADMISSION HISTORY AND PHYSICAL  Taylor Lopez is a 39 y.o. female [phone] with IUP at [redacted]w[redacted]d by early [place] at HD presenting for IOL for PD. She has been feeling ctx just in the last few hours. She reports +FMs, No LOF, no VB, no blurry vision, headaches or peripheral edema, and RUQ pain.  She plans on breast feeding. She requests Nexplanon at HD for birth control. She [facility] her [facility]   Dating: By early [place] at HD --->  Estimated Date of Delivery: 11/10/19  Sono: 10/29   @[redacted]w[redacted]d , CWD, normal anatomy, transverse presentation, anterior placental lie, 544g, 37% EFW  Prenatal History/Complications: AMA Transient HTN in early pregnancy  Grand Multip Hx of preterm delivery at 36w  Past Medical History: No past medical history on file.  Past Surgical History: Past Surgical History:  Procedure Laterality Date  . LAPAROSCOPIC CHOLECYSTECTOMY      Obstetrical History: OB History    Gravida  5   Para  4   Term  3   Preterm  1   AB      Living  4     SAB      TAB      Ectopic      Multiple  0   Live Births  4           Social History: Social History   Socioeconomic History  . Marital status: Single    Spouse name: Not on file  . Number of children: Not on file  . Years of education: Not on file  . Highest education level: Not on file  Occupational History  . Not on file  Tobacco Use  . Smoking status: Never Smoker  . Smokeless tobacco: Never Used  Substance and Sexual Activity  . Alcohol use: Yes    Comment: occasionally  . Drug use: No  . Sexual activity: Yes    Birth control/protection: None  Other Topics Concern  . Not on file  Social History Narrative  . Not on file   Social Determinants of Health   Financial Resource Strain:   . Difficulty of Paying Living Expenses: Not on file  Food Insecurity:   . Worried About Charity fundraiser in the Last Year: Not on file  . Ran Out of Food in the Last Year: Not on file   Transportation Needs:   . Lack of Transportation (Medical): Not on file  . Lack of Transportation (Non-Medical): Not on file  Physical Activity:   . Days of Exercise per Week: Not on file  . Minutes of Exercise per Session: Not on file  Stress:   . Feeling of Stress : Not on file  Social Connections:   . Frequency of Communication with Friends and Family: Not on file  . Frequency of Social Gatherings with Friends and Family: Not on file  . Attends Religious Services: Not on file  . Active Member of Clubs or Organizations: Not on file  . Attends Archivist Meetings: Not on file  . Marital Status: Not on file    Family History: Family History  Problem Relation Age of Onset  . Hypertension Mother   . Hypertension Maternal Grandmother   . Diabetes Maternal Grandmother     Allergies: No Known Allergies  Medications Prior to Admission  Medication Sig Dispense Refill Last Dose  . aspirin EC 81 MG tablet Take 1 tablet (81 mg total) by mouth daily. 60 tablet 2   .  ibuprofen (ADVIL,MOTRIN) 600 MG tablet Take 1 tablet (600 mg total) by mouth every 6 (six) hours. (Patient not taking: Reported on 06/09/2019) 30 tablet 0   . norethindrone (MICRONOR,CAMILA,ERRIN) 0.35 MG tablet Take 1 tablet (0.35 mg total) by mouth daily. (Patient not taking: Reported on 06/09/2019) 1 Package 11   . Prenatal Vit-Fe Fumarate-FA (PRENATAL MULTIVITAMIN) TABS tablet Take 1 tablet by mouth daily at 12 noon.     . Prenatal Vit-Fe Fumarate-FA (PREPLUS) 27-1 MG TABS Take 1 tablet by mouth daily. 30 tablet 13      Review of Systems   All systems reviewed and negative except as stated in HPI  Temperature 98 F (36.7 C), temperature source Oral, resp. rate 18, height 5\' 3"  (1.6 m), weight 95.3 kg, unknown if currently breastfeeding. General appearance: alert, cooperative and appears stated age Lungs: normal effort Heart: regular rate  Abdomen: soft, non-tender; bowel sounds normal Pelvic: gravid  uterus GU: No vaginal lesions  Extremities: Homans sign is negative, no sign of DVT Presentation: cephalic by exam Fetal monitoringBaseline: 150 bpm, Variability: Good {> 6 bpm), Accelerations: Reactive and Decelerations: Absent Uterine activity: Frequency: Every 4 minutes Dilation: 2.5 Effacement (%): 50 Station: -3 Exam by:: Dr. [email or web address]   Prenatal labs: ABO, Rh: O/Positive/-- (09/24 1107) Antibody: Negative (09/24 1107) Rubella: >33.00 (09/24 1107) RPR: Non Reactive (12/02 0826)  HBsAg: Negative (09/24 1107)  HIV: Non Reactive (12/02 0826)  GBS: Negative/-- (02/02 1132)  2 hr Glucola WNL Genetic screening  Quad neg Anatomy [birth year]1983 WNL  Prenatal Transfer Tool  Maternal Diabetes: No Genetic Screening: Normal Maternal Ultrasounds/Referrals: Normal Fetal Ultrasounds or other Referrals:  None Maternal Substance Abuse:  No Significant Maternal Medications:  None Significant Maternal Lab Results: Group B Strep negative  No results found for this or any previous visit (from the past 24 hour(s)).  Patient Active Problem List   Diagnosis Date Noted  . Labor and delivery, indication for care 11/15/2019  . [redacted] weeks gestation of pregnancy 11/15/2019  . Breast pain, left 09/01/2019  . Antepartum transient hypertension 06/09/2019  . Antepartum multigravida of advanced maternal age 41/24/2020  . Supervision of high risk pregnancy, antepartum 06/09/2019  . Language barrier 06/09/2019    Assessment/Plan:  Taylor Lopez is a 39 y.o. 24 at [redacted]w[redacted]d here for PD IOL.  #Labor: Vertex by exam. Foley bulb placed manually and filled with 60 mL water; patient tolerated well. Due to multiparous status and fairly favorable cervix, will start Pit. Plan to AROM when Foley bulb out. Anticipate SVD. #Pain: Per patient request #FWB: Cat I; EFW: 3800g (proven to 8lbs 9oz) #ID:  GBS neg #MOF: Breast #MOC: Nexplanon at HD (no insurance) #Circ:  declines  [redacted]w[redacted]d, MD [facility]  Fellow, [facility] for [facility] 11/15/2019, 12:48 AM

## 2019-11-15 NOTE — Anesthesia Procedure Notes (Signed)
Epidural Patient location during procedure: OB Start time: 11/15/2019 6:16 AM End time: 11/15/2019 6:28 AM  Staffing Anesthesiologist: Lannie Fields, DO Performed: anesthesiologist   Preanesthetic Checklist Completed: patient identified, IV checked, risks and benefits discussed, monitors and equipment checked, pre-op evaluation and timeout performed  Epidural Patient position: sitting Prep: DuraPrep and site prepped and draped Patient monitoring: continuous pulse ox, blood pressure, heart rate and cardiac monitor Approach: midline Location: L3-L4 Injection technique: LOR air  Needle:  Needle type: Tuohy  Needle gauge: 17 G Needle length: 9 cm Needle insertion depth: 6.5 cm Catheter type: closed end flexible Catheter size: 19 Gauge Catheter at skin depth: 13 cm Test dose: negative  Assessment Sensory level: T8 Events: blood not aspirated, injection not painful, no injection resistance, no paresthesia and negative IV test  Additional Notes Patient identified. Risks/Benefits/Options discussed with patient including but not limited to bleeding, infection, nerve damage, paralysis, failed block, incomplete pain control, headache, blood pressure changes, nausea, vomiting, reactions to medication both or allergic, itching and postpartum back pain. Confirmed with bedside nurse the patient's most recent platelet count. Confirmed with patient that they are not currently taking any anticoagulation, have any bleeding history or any family history of bleeding disorders. Patient expressed understanding and wished to proceed. All questions were answered. Sterile technique was used throughout the entire procedure. Please see nursing notes for vital signs. Test dose was given through epidural catheter and negative prior to continuing to dose epidural or start infusion. Warning signs of high block given to the patient including shortness of breath, tingling/numbness in hands, complete motor  block, or any concerning symptoms with instructions to call for help. Patient was given instructions on fall risk and not to get out of bed. All questions and concerns addressed with instructions to call with any issues or inadequate analgesia.  Reason for block:procedure for pain

## 2019-11-15 NOTE — Progress Notes (Signed)
Admission teaching completed with video interpreter.

## 2019-11-15 NOTE — Lactation Note (Signed)
This note was copied from a baby's chart. Lactation Consultation Note  Patient Name: Taylor Lopez [id number] Date: 11/15/2019 Reason for consult: Initial assessment;Term  P5 mother whose infant is now 15 hours old.  This is a term baby.  Mother breast fed her other four children for one year each.  Her other children are now 27, 56, 41, and 39 years old  In house Spanish interpreter, Wallene Huh, used for interpretation.  Baby was swaddled and asleep in the bassinet when I arrived.  Mother had no questions/concerns related to breast feeding.  She stated, "I know about that." and laughed.    Encouraged to feed 8-12 times/24 hours or sooner if baby shows feeding cues.  Mother has been able to express colostrum and I encouraged her to continue practicing hand expression before/after feedings to help increase her milk supply.  Container provided.  Mother was happy to inform me that he has latched well since delivery and she denies pain with latching.  Mom made aware of O/P services, breastfeeding support groups, community resources, and our phone # for post-discharge questions. Information provided in Spanish.  Father present.  Mother will call for lactation assistance as needed.   Maternal Data Formula Feeding for Exclusion: No Has patient been taught Hand Expression?: Yes Does the patient have breastfeeding experience prior to this delivery?: Yes  Feeding Feeding Type: Breast Fed  LATCH Score                   Interventions    Lactation Tools Discussed/Used     Consult Status Consult Status: Follow-up Date: 11/16/19 Follow-up type: In-patient    Tanis Burnley R Indira Sorenson 11/15/2019, 2:00 PM

## 2019-11-15 NOTE — Progress Notes (Signed)
Patient continuing to be hypertensive after delivery with BP's ranging mostly in the high 130's-low 150's with tachycardia. Her urine protein/creatinine ratio is 0.29 which classifies her as gestational hypertension. She reports no headaches, blurry vision, RUQ pain, chest pain, shortness of breath. She does report feeling some swelling in her legs. She first noticed it 2 days ago and it has minimally gotten worse since the delivery. On exam, she has minimal bilateral edema without tenderness. Plan to start nifedipine XR 30mg  and monitor for BP response this afternoon.

## 2019-11-15 NOTE — Progress Notes (Signed)
Ordered pt meals, by Viria Alvarez Spanish Interpreter. 

## 2019-11-15 NOTE — Anesthesia Postprocedure Evaluation (Addendum)
Anesthesia Post Note  Patient: Taylor Lopez  Procedure(s) Performed: AN AD HOC LABOR EPIDURAL     Patient location during evaluation: Mother Baby Anesthesia Type: Epidural Level of consciousness: awake Pain management: satisfactory to patient Vital Signs Assessment: post-procedure vital signs reviewed and stable Respiratory status: spontaneous breathing Cardiovascular status: stable Anesthetic complications: no   Report via Glines RN due to language barrier Last Vitals:  Vitals:   11/15/19 0940 11/15/19 1050  BP: (!) 145/79 (!) 144/77  Pulse: (!) 112 (!) 119  Resp: 18 18  Temp: 36.8 C 37 C  SpO2:      Last Pain:  Vitals:   11/15/19 1050  TempSrc: Oral  PainSc: 0-No pain   Pain Goal:                   [employer]

## 2019-11-16 ENCOUNTER — Telehealth: Payer: Self-pay | Admitting: Radiology

## 2019-11-16 LAB — BIRTH TISSUE RECOVERY COLLECTION (PLACENTA DONATION)

## 2019-11-16 MED ORDER — IBUPROFEN 600 MG PO TABS: 600.0000 mg | ORAL_TABLET | Freq: Three times a day (TID) | ORAL | 0 refills | Status: DC | PRN

## 2019-11-16 MED ORDER — NIFEDIPINE ER 30 MG PO TB24: 30.0000 mg | ORAL_TABLET | Freq: Every day | ORAL | 1 refills | Status: DC

## 2019-11-16 MED FILL — IBUPROFEN 600 MG TABS: 600 | 10 days supply | Qty: 30 | Fill #0

## 2019-11-16 MED FILL — NIFEdipine ER 30 MG TB24: 30 | 30 days supply | Qty: 30 | Fill #0

## 2019-11-16 NOTE — Progress Notes (Signed)
Discharge teaching and instructions given with spanish interpreter- [facility]

## 2019-11-16 NOTE — Discharge Instructions (Signed)
Cuidados ps-parto aps parto normal Postpartum Care After Vaginal Delivery Este folheto fornece informaes sobre como cuidar de si, desde o momento do parto at as 6-12 semanas posteriores (perodo ps-parto). Seu mdico tambm poder fornecer instrues mais especficas. Caso tenha problemas ou perguntas, entre em contato com o seu mdico. Siga essas instrues em casa: Sangramento vaginal   normal ter sangramento vaginal (lquios) aps o parto. Use um absorvente para sangramento e secreo vaginal. ? Durante a primeira semana aps o parto, a quantidade e a aparncia dos lquios costumam ser semelhantes a um perodo menstrual. ? Nas semanas seguintes, ele diminuir gradualmente para uma secreo amarelada e seca. ? Para a maioria das mulheres, os lquios cessam completamente at 4-6 semanas aps o parto. O sangramento vaginal pode variar de mulher para mulher.  Troque seus absorventes com frequncia. Fique atento a qualquer alterao no seu fluxo, como: ? Um aumento repentino no volume. ? Mudana na cor. ? Grandes cogulos sanguneos.  Se voc expelir um cogulo sanguneo pela vagina, guarde-o e ligue para seu mdico. No elimine os cogulos no vaso sanitrio sem falar com seu mdico antes.  No use absorventes internos nem faa duchas vaginais at o seu mdico dizer que  seguro faz-lo.  Caso no esteja amamentando, sua menstruao dever retornar 6-8 semanas aps o parto. Caso esteja alimentando a criana apenas no peito (aleitamento materno exclusivo), seu ciclo menstrual pode no retornar at que voc pare de amamentar. Cuidados com a rea perineal  Mantenha a rea entre a vagina e o nus (perneo) limpa e seca conforme orientado pelo seu mdico. Use absorventes com medicamentos e sprays e cremes para aliviar a dor conforme orientado.  Se um corte no perneo (episiotomia) ou um rasgo na vagina tiver sido feito em voc, verifique se h sinais de infeco na rea at que tenha  cicatrizado. Verifique a ocorrncia de: ? Aumento da vermelhido, do inchao ou da dor. ? Lquido ou sangue saindo do corte ou rasgo. ? Calor. ? Pus ou mau cheiro.  Voc pode receber uma garrafa de esguicho para usar em vez de passar papel higinico para limpar a rea do perneo depois de ir ao banheiro. Conforme comear a cicatrizar, voc pode usar o esguicho antes de se limpar. Lembre-se de limpar suavemente.  Para aliviar a dor causada por uma episiotomia, um corte na vagina ou veias inchadas no nus (hemorroidas), tente tomar um banho de assento morno 2-3 vezes ao dia. Um banho de assento  um banho morno que voc toma enquanto est sentada. A gua deve chegar somente at o seu quadril e cobrir suas ndegas. Cuidados com os seios  Nos primeiros dias aps o parto, suas mamas podem ficar pesadas, cheias e desconfortveis (ingurgitao mamria). Tambm pode vazar leite de suas mamas. Seu mdico pode sugerir formas de aliviar o desconforto. A ingurgitao mamria deve passar em poucos dias.  Caso esteja amamentando: ? Use um suti que oferece apoio aos seus seios e seja do tamanho adequado. ? Mantenha os mamilos limpos e secos. Aplique cremes e pomadas conforme as orientado pelo seu mdico. ? Voc pode precisar usar absorventes de mama para reter o leite que vazar de seus seios. ? Voc pode ter contraes uterinas toda vez que amamentar durante vrias semanas aps o parto. As contraes uterinas ajudam seu tero a retornar ao tamanho normal. ? Se voc tiver algum problema com a amamentao, converse com seu mdico ou consultor de lactao.  Caso no esteja amamentando: ? Evite tocar muito nas mamas. Isso   pode fazer com que seus seios produzam mais leite. ? Use um suti bem ajustado e compressas frias para ajudar no inchao. ? No extraia leite (bombear). Isso faz voc produzir mais leite. Intimidade e sexualidade  Pergunte ao seu mdico sobre quando voc poder ter relaes sexuais. Isso  poder depender de: ? Seu risco de infeces. ? Se voc est cicatrizando rpido. ? Seu conforto e desejo de realizar atividade sexual.  Voc pode engravidar aps o parto mesmo se voc no tiver menstruado. Se desejar, converse com seu mdico sobre mtodos de controle de natalidade (contracepo). Medicamentos  Tome medicamentos vendidos com ou sem receita mdica somente de acordo com as indicaes do seu mdico.  Caso tenha recebido uma prescrio de antibitico, tome-o somente como determinado pelo seu mdico. No pare de tomar o antibitico mesmo se comear a se sentir melhor. Atividades  Retorne gradualmente s suas atividades normais de acordo com as orientaes do seu mdico. Pergunte ao seu mdico quais atividades so seguras para voc.  Repouse o mximo possvel. Tente descansar ou tirar uma soneca quando seu beb estiver dormindo. Alimentos e bebidas   Beba lquidos em quantidade suficiente para manter a urina na cor amarelo-claro.  Consuma alimentos com elevado teor de fibras todos os dias. Eles podero ajudar a prevenir ou aliviar a constipao. Alimentos ricos em fibras incluem: ? Cereais e pes integrais. ? Arroz integral. ? Feijes. ? Frutas e legumes frescos.  No tente perder peso rapidamente cortando calorias.  Tome suas vitaminas pr-natal at seu check-up ps-parto ou at seu mdico lhe dizer para parar. Estilo de vida  No use nenhum produto que contenha nicotina ou tabaco, como cigarros tradicionais e cigarros eletrnicos. Caso precise de ajuda para parar de fumar, fale com seu mdico.  No consuma lcool, especialmente se estiver amamentando. Instrues gerais  Comparea a todas as consultas de acompanhamento suas e do beb de acordo com as orientaes do seu mdico. A maioria das mulheres passa com seu mdico para um check-up ps-parto nas primeiras 3-6 semanas aps o parto. Entre em contato com um mdico se:  Sentir-se incapaz de lidar com as mudanas  que uma criana traz  sua vida, e esse sentimento no passar.  Voc se sentir incomumente triste ou preocupada.  Suas mamas ficarem vermelhas, doloridas ou duras.  Tiver febre.  Tiver dificuldade em segurar a urina ou evitar que a urina vaze.  Tiver pouco ou nenhum interesse nas atividades de que voc gosta.  No tiver amamentado nenhuma vez e no menstruar por 12 semanas aps o parto.  Tiver parado de amamentar e no menstruar por 12 semanas aps parar de amamentar.  Tiver dvidas sobre como cuidar de voc mesma ou do beb.  Expelir um cogulo sanguneo pela vagina. Obtenha ajuda imediatamente se:  Sentir dor no peito.  Tiver dificuldade de respirar.  Tiver dor repentina e intensa nas pernas.  Tiver dor intensa ou clicas na parte inferior do abdome.  Sangrar muito pela vagina a ponto de ter que usar mais de um absorvente por hora. O sangramento no pode ser mais intenso do que a menstruao mais intensa que voc costuma ter.  Sentir dor de cabea intensa.  Desmaiar.  Tiver viso embaada ou manchas na viso.  Tiver corrimento vaginal com cheiro ruim.  Tiver pensamentos de machucar a si mesma ou ao seu beb. Se sentir vontade de ferir a si mesmo ou a terceiros ou pensar em tirar a prpria vida, procure ajuda imediatamente. Voc pode ir para o pronto-socorro   mais prximo ou ligar para:  O nmero de emergncia local (911, nos EU[place]).  Um servio telefnico de preveno do suicdio, como o National Suicide Prevention Lifeline, no nmero 1-800-273-8255. Ele funciona 24 horas por dia. Resumo  O perodo de tempo logo aps o nascimento do recm-nascido at 6-12 semanas aps o parto  chamado de perodo ps-parto.  Retorne gradualmente s suas atividades normais de acordo com as orientaes do seu mdico.  Comparea a todas as consultas de acompanhamento suas e do beb de acordo com as orientaes do seu mdico. Estas informaes no se destinam a substituir as  recomendaes de seu mdico. No deixe de discutir quaisquer dvidas com seu mdico. Document Revised: 12/12/2017 Elsevier Patient Education  2020 Elsevier Inc.  

## 2019-11-16 NOTE — Telephone Encounter (Signed)
Left message with interpreter services informing patient of postpartum appointment.

## 2019-11-16 NOTE — Lactation Note (Signed)
This note was copied from a baby's chart. Lactation Consultation Note  Patient Name: Taylor Lopez VHQIO'N Date: 11/16/2019 Reason for consult: Follow-up assessment   P5, Baby 27 hours old.  [facility] spanish interpreter present. Mother states baby show his hunger cues and he has been bf well. Feed on demand with cues.  Goal 8-12+ times per day after first 24 hrs.  Place baby STS if not cueing.  Reviewed engorgement care and monitoring voids/stools. Mother does not have any additional questions at this time.    Maternal Data    Feeding Feeding Type: Breast Fed  LATCH Score                   Interventions Interventions: Breast feeding basics reviewed  Lactation Tools Discussed/Used     Consult Status Consult Status: PRN    Hardie Pulley 11/16/2019, 10:33 AM

## 2019-11-22 ENCOUNTER — Other Ambulatory Visit: Payer: Self-pay

## 2019-11-22 ENCOUNTER — Ambulatory Visit (INDEPENDENT_AMBULATORY_CARE_PROVIDER_SITE_OTHER): Payer: Self-pay | Admitting: *Deleted

## 2019-11-22 VITALS — BP 132/83 | HR 92

## 2019-11-22 DIAGNOSIS — O139 Gestational [pregnancy-induced] hypertension without significant proteinuria, unspecified trimester: Secondary | ICD-10-CM

## 2019-11-22 NOTE — Progress Notes (Signed)
Subjective:  Taylor Lopez is a 39 y.o. female here for BP check.   Hypertension ROS: taking medications as instructed, no medication side effects noted, no TIA's, no chest pain on exertion, no dyspnea on exertion and no swelling of ankles.    Objective:  BP (!) 152/90   Pulse (!) 103  and 132/83 Appearance alert, well appearing, and in no distress. General exam BP noted to be well controlled today in office.    Assessment:   Blood Pressure stable.   Plan:  Current treatment plan is effective, no change in therapy.   Interpreter used.   Scheryl Marten, RN

## 2019-11-22 NOTE — Progress Notes (Signed)
Patient seen and assessed by nursing staff.  Agree with documentation and plan.  

## 2019-12-13 ENCOUNTER — Other Ambulatory Visit: Payer: Self-pay

## 2019-12-13 ENCOUNTER — Ambulatory Visit (INDEPENDENT_AMBULATORY_CARE_PROVIDER_SITE_OTHER): Payer: Medicaid Other | Admitting: Obstetrics and Gynecology

## 2019-12-13 NOTE — Progress Notes (Signed)
Obstetrics and Gynecology Postpartum Visit  Appointment Date: 12/13/2019  OBGYN Clinic: [facility]  Primary Care Provider: Patient, No Pcp Per  Chief Complaint:  Chief Complaint  Patient presents with  . Postpartum Care    History of Present Illness: Taylor Lopez is a 39 y.o. Hispanic [id number] (No LMP recorded.), seen for the above chief complaint. Her past medical history is significant for AMA   She is s/p SVD/intact perineum on 3/2; she was discharged to home on PPD#1  Vaginal bleeding or discharge: old blood like discharge <1 pad per day Breast or formula feeding: breast feeding only Intercourse: No  Contraception after delivery: abstinence PP depression s/s: No  Any bowel or bladder issues: No  Pap smear: no abnormalities (date: 2020)  Review of Systems: as noted in the History of Present Illness.  Medications Taylor Lopez had no medications administered during this visit. Current Outpatient Medications  Medication Sig Dispense Refill  . Prenatal Vit-Fe Fumarate-FA (PREPLUS) 27-1 MG TABS Take 1 tablet by mouth daily. 30 tablet 13  . ibuprofen (ADVIL) 600 MG tablet Take 1 tablet (600 mg total) by mouth every 8 (eight) hours as needed for mild pain. (Patient not taking: Reported on 12/13/2019) 30 tablet 0   No current facility-administered medications for this visit.    Allergies Patient has no known allergies.  Physical Exam:  BP 129/78   Wt 174 lb 3.2 oz (79 kg)   BMI 30.86 kg/m  Body mass index is 30.86 kg/m. General appearance: Well nourished, well developed female in no acute distress.  Neuro/Psych:  Normal mood and affect.  Skin:  Warm and dry.   Laboratory: none  PP Depression Screening:  EPDS score zero  Assessment: pt doing well  Plan:  Patient to go to [facility] for nexplanon D/c procardia xl 30 qday and rn bp check one week  Interpreter used  RTC 1wk  Cornelia Copa MD Attending Center for [employer] Midwife)

## 2019-12-13 NOTE — Progress Notes (Signed)
   PRENATAL VISIT NOTE  Subjective:  Taylor Lopez is a 39 y.o. G5P4105 at 32 wks being seen today for ongoing prenatal care.  She is currently monitored for the following issues for this low-risk pregnancy and has Antepartum transient hypertension; Antepartum multigravida of advanced maternal age; Supervision of high risk pregnancy, antepartum; Language barrier; Breast pain, left; Labor and delivery, indication for care; [redacted] weeks gestation of pregnancy; and Gestational hypertension on their problem list.  Patient reports no complaints.  Contractions: Not present. Vag. Bleeding: None.  Movement: Present. Denies leaking of fluid.   The following portions of the patient's history were reviewed and updated as appropriate: allergies, current medications, past family history, past medical history, past social history, past surgical history and problem list.   Objective:   Vitals:   09/13/19 1010  BP: 114/70  Pulse: 98  Weight: 195 lb (88.5 kg)    Fetal Status: Fetal Heart Rate (bpm): 150   Movement: Present     General:  Alert, oriented and cooperative. Patient is in no acute distress.  Skin: Skin is warm and dry. No rash noted.   Cardiovascular: Normal heart rate noted  Respiratory: Normal respiratory effort, no problems with respiration noted  Abdomen: Soft, gravid, appropriate for gestational age.  Pain/Pressure: Absent     Pelvic: Cervical exam deferred        Extremities: Normal range of motion.  Edema: None  Mental Status: Normal mood and affect. Normal behavior. Normal judgment and thought content.   Assessment and Plan:  Pregnancy: G5P4105 at 32 wk 1. Language barrier   2. Supervision of high risk pregnancy, antepartum Continue routing care  Preterm labor symptoms and general obstetric precautions including but not limited to vaginal bleeding, contractions, leaking of fluid and fetal movement were reviewed in detail with the patient. Please refer to After Visit Summary  for other counseling recommendations.   Return in about 3 weeks (around 10/04/2019) for in person.  Future Appointments  Date Time Provider Department Center  12/13/2019  2:00 PM [place], MD [facility]    Taylor Bossier, MD

## 2019-12-21 ENCOUNTER — Ambulatory Visit (INDEPENDENT_AMBULATORY_CARE_PROVIDER_SITE_OTHER): Payer: Self-pay | Admitting: *Deleted

## 2019-12-21 ENCOUNTER — Other Ambulatory Visit: Payer: Self-pay

## 2019-12-21 VITALS — BP 119/74 | HR 87

## 2019-12-21 DIAGNOSIS — O139 Gestational [pregnancy-induced] hypertension without significant proteinuria, unspecified trimester: Secondary | ICD-10-CM

## 2019-12-21 NOTE — Progress Notes (Signed)
Subjective:  Taylor Lopez is a 39 y.o. female here for BP check., after stopping the Procardia last week.     Objective:  BP 119/74   Pulse 87   Appearance alert, well appearing, and in no distress. General exam BP noted to be well controlled today in office.    Assessment:   Blood Pressure well controlled.   Plan:  Follow up as needed.

## 2019-12-21 NOTE — Progress Notes (Signed)
Attestation of Attending Supervision of clinical support staff: I agree with the care provided to this patient and was available for any consultation.  I have reviewed the RN's note and chart. I was available for consult and helped communicate in Spanish with the patient her normal blood pressure and ability to not take the blood pressure medications safely.   Federico Flake, MD, MPH, ABFM Attending Physician Faculty Practice- Center for [facility]

## 2020-04-12 ENCOUNTER — Telehealth: Payer: Self-pay

## 2020-04-12 NOTE — Telephone Encounter (Signed)
Telephoned patient at home number using interpreter Myles Gip). Left a voice message with BCCCP information.

## 2020-04-13 ENCOUNTER — Other Ambulatory Visit: Payer: Self-pay

## 2020-04-13 DIAGNOSIS — N644 Mastodynia: Secondary | ICD-10-CM

## 2020-05-01 ENCOUNTER — Ambulatory Visit
Admission: RE | Admit: 2020-05-01 | Discharge: 2020-05-01 | Disposition: A | Discharge status: Home / Self Care | Payer: No Typology Code available for payment source | Source: Ambulatory Visit | Attending: Obstetrics and Gynecology | Admitting: Obstetrics and Gynecology

## 2020-05-01 ENCOUNTER — Ambulatory Visit: Payer: Self-pay | Admitting: *Deleted

## 2020-05-01 ENCOUNTER — Other Ambulatory Visit: Payer: Self-pay

## 2020-05-01 VITALS — BP 118/62 | Temp 97.7°F

## 2020-05-01 DIAGNOSIS — N6314 Unspecified lump in the right breast, lower inner quadrant: Secondary | ICD-10-CM

## 2020-05-01 DIAGNOSIS — N644 Mastodynia: Secondary | ICD-10-CM

## 2020-05-01 DIAGNOSIS — Z1239 Encounter for other screening for malignant neoplasm of breast: Secondary | ICD-10-CM

## 2020-05-01 NOTE — Patient Instructions (Addendum)
Informed Taylor Lopez about breast self awareness. Patient did not need a Pap smear today due to last Pap smear and HPV typing was on 06/09/2019. Let her know BCCCP will cover Pap smears and HPV typing every 5 years unless has a history of abnormal Pap smears. Referred patient to the [facility] for diagnostic mammogram. Appointment scheduled for May 01, 2020 at 10:50am. Patient aware of appointment and will be there. Taylor Lopez verbalized understanding.  [name], RN, FNP student 10:40 AM

## 2020-05-01 NOTE — Progress Notes (Signed)
Ms. Ruchama Kubicek is a 39 y.o. female who presents to [facility] clinic today with complaints of a right breast lump with pain for 2 months.    Pap Smear: Pap smear not completed today. Last Pap smear was 06/09/2019 at [facility] clinic and was normal with negative HPV. Per patient has no history of an abnormal Pap smear. Last Pap smear result is available in Epic.   Physical exam: Breasts Breasts symmetrical. No skin abnormalities bilateral breasts. No nipple retraction bilateral breasts. No nipple discharge bilateral breasts. No lymphadenopathy. No lumps palpated bilateral breasts. Patient is currently breastfeeding. A lump was palpated in the right breast at 5 o'clock, 4 cm from the nipple. Lump is somewhat consistent with a milk duct. Patient states that she felt tired and feverish close to when she first noticed the lump. Patient states that breastfeeding relieves the pain and there is only pain with the pressure she feels when her breasts are full. She states that she has not noticed any changes in her breast milk. She also complains of left lower breast tenderness on exam.     Pelvic/Bimanual Pap is not indicated today.    Smoking History: Patient has never smoked.    Patient Navigation: Patient education provided. Access to services provided for patient through BCCCP program. Laveda Norman, Spanish interpreter provided through [facility].   Breast and Cervical Cancer Risk Assessment: Patient has family history of breast cancer in her maternal grandmother. No known genetic mutations, or radiation treatment to the chest before age 76. Patient does not have history of cervical dysplasia, immunocompromised, or DES exposure in-utero.  Risk Assessment    Risk Scores      05/01/2020   Last edited by: Narda Rutherford, LPN   5-year risk: 0.3 %   Lifetime risk: 5.2 %          A: BCCCP exam without pap smear Complaints of a right breast lump with pain.   P: Referred patient to the  [facility] for a diagnostic mammogram. Appointment scheduled May 01, 2020 at 10:50am.  [name], RN, FNP student 05/01/2020 10:33 AM   Attestation of Supervision of Student:  I confirm that I have verified the information documented in the nurse practitioner student's note and that I have also personally reperformed the history, physical exam and all medical decision making activities.  I have verified that all services and findings are accurately documented in this student's note; and I agree with management and plan as outlined in the documentation. I have also made any necessary editorial changes.  Brannock, Kathaleen Maser, RN Center for [employer], [facility] 05/01/2020 12:34 PM

## 2021-11-18 ENCOUNTER — Other Ambulatory Visit: Payer: Self-pay | Admitting: Obstetrics and Gynecology

## 2021-11-18 DIAGNOSIS — Z1231 Encounter for screening mammogram for malignant neoplasm of breast: Secondary | ICD-10-CM

## 2021-12-26 ENCOUNTER — Ambulatory Visit: Payer: Self-pay | Admitting: *Deleted

## 2021-12-26 ENCOUNTER — Ambulatory Visit
Admission: RE | Admit: 2021-12-26 | Discharge: 2021-12-26 | Disposition: A | Discharge status: Home / Self Care | Payer: No Typology Code available for payment source | Source: Ambulatory Visit | Attending: Obstetrics and Gynecology | Admitting: Obstetrics and Gynecology

## 2021-12-26 ENCOUNTER — Encounter (INDEPENDENT_AMBULATORY_CARE_PROVIDER_SITE_OTHER): Payer: Self-pay

## 2021-12-26 VITALS — BP 120/72 | Wt 193.9 lb

## 2021-12-26 DIAGNOSIS — Z1239 Encounter for other screening for malignant neoplasm of breast: Secondary | ICD-10-CM

## 2021-12-26 DIAGNOSIS — N644 Mastodynia: Secondary | ICD-10-CM

## 2021-12-26 DIAGNOSIS — Z1231 Encounter for screening mammogram for malignant neoplasm of breast: Secondary | ICD-10-CM

## 2021-12-26 NOTE — Progress Notes (Signed)
[name] is a 41 y.o. female who presents to [facility] clinic today with complaint of bilateral breast pain greater within her right breast since prior to her previous exam 05/01/2020. Patient had a diagnostic mammogram to follow up 05/01/2020 that was negative.patient states the pain comes and goes. Patient rates the pain at a 4 out of 10. Patient stated that she feels the right breast lump at times that was noted on her previous exam 05/01/2020. ?  ?Pap Smear: Pap smear not completed today. Last Pap smear was 06/09/2019 at [facility] [place] [facility] clinic and was normal with negative HPV. Per patient has no history of an abnormal Pap smear. Last Pap smear result is available in Epic. ?  ?Physical exam: ?Breasts ?Breasts symmetrical. No skin abnormalities bilateral breasts. No nipple retraction bilateral breasts. No nipple discharge bilateral breasts. No lymphadenopathy. No lumps palpated bilateral breasts. Complaints of bilateral outer breast tenderness on exam. ? ?MS DIGITAL DIAGNOSTIC BILATERAL ? ?Result Date: 05/01/2020 ?CLINICAL DATA:  41 year old female with a new lump and tenderness in the medial right breast. The patient is currently breast feeding. EXAM: DIGITAL DIAGNOSTIC BILATERAL MAMMOGRAM WITH CAD ULTRASOUND RIGHT BREAST COMPARISON:  Previous exam(s). ACR Breast Density Category d: The breast tissue is extremely dense, which lowers the sensitivity of mammography. FINDINGS: Mammogram: Right breast: There are two skin BBs marking the palpable sites of concern, one reported by the patient in the other reported by her doctor in the lower inner right breast. Spot compression tomosynthesis views were performed in addition to standard views. There is no definite abnormality at either site however the breast tissue is extremely dense which limits evaluation. Left breast: No definite suspicious mass, distortion, or microcalcifications are identified to suggest presence of  malignancy. Mammographic images were processed with CAD. On physical exam, I do not palpate a fixed discrete mass in the lower inner right breast. There is mild heterogeneity to the tissue which is likely related to breast feeding. There are no overt skin changes. Ultrasound: Targeted ultrasound is performed throughout the lower inner quadrant the right breast demonstrating no suspicious cystic or solid mass or other abnormality. IMPRESSION: No mammographic or sonographic evidence of malignancy at the palpable sites of concern in the lower inner right breast. RECOMMENDATION: 1. Recommend any further workup of the right breast palpable sites be on a clinical basis. Patient was counseled to continue self examination at home. 2.  Screening mammogram in one year.(Code:SM-B-01Y) I have discussed the findings and recommendations with the patient. If applicable, a reminder letter will be sent to the patient regarding the next appointment. BI-RADS CATEGORY  1: Negative. Electronically Signed   By: Audie Pinto M.D.   On: 05/01/2020 11:41     ?  ?Pelvic/Bimanual ?Pap is not indicated today per BCCCP guidelines. ?  ?Smoking History: ?Patient has never smoked. ?  ?Patient Navigation: ?Patient education provided. Access to services provided for patient through [place] program. Spanish interpreter Rudene Anda from [facility] provided.  ?  ?Breast and Cervical Cancer Risk Assessment: ?Patient has family history of her maternal grandmother having breast cancer. Patient has no known genetic mutations or history of radiation treatment to the chest before age 89. Patient does not have history of cervical dysplasia, immunocompromised, or DES exposure in-utero. ? ?Risk Assessment   ? ? Risk Scores   ? ?   12/26/2021 05/01/2020  ? Last edited by: Demetrius Revel, LPN Barney Drain, RT  ? 5-year risk: 0.3 %  0.3 %  ? Lifetime risk: 5.1 % 5.2 %  ? ?  ?  ? ?  ? ? ?A: ?BCCCP exam without pap smear ?Complaint of bilateral breast  pain. ? ?P: ?Referred patient to the [facility] for a screening mammogram on mobile unit per recommendation. Appointment scheduled Thursday, December 26, 2021 at 1050. ? ?Loletta Parish, RN ?12/26/2021 10:16 AM   ?

## 2021-12-26 NOTE — Patient Instructions (Signed)
Explained breast self awareness with [name]. Patient did not need a Pap smear today due to last Pap smear and HPV typing was 06/09/2019. Let her know BCCCP will cover Pap smears and HPV typing every 5 years unless has a history of abnormal Pap smears. Referred patient to the [facility] for a screening mammogram on mobile unit per recommendation. Appointment scheduled Thursday, December 26, 2021 at 1050. Patient aware of appointment and will be there. Let patient know the Breast Center will follow up with her within the next couple weeks with results of mammogram by letter or phone. Taylor Lopez verbalized understanding. ? ?Cariana Karge, Kathaleen Maser, RN ?10:16 AM ? ? ? ? ?

## 2022-11-12 ENCOUNTER — Telehealth: Payer: Self-pay

## 2022-11-12 NOTE — Telephone Encounter (Signed)
Telephoned patient using language line interpreter#[id number]. Left a voice message with BCCCP contact information.

## 2022-11-13 ENCOUNTER — Other Ambulatory Visit: Payer: Self-pay | Admitting: Obstetrics & Gynecology

## 2022-11-13 DIAGNOSIS — Z1231 Encounter for screening mammogram for malignant neoplasm of breast: Secondary | ICD-10-CM

## 2023-01-01 ENCOUNTER — Ambulatory Visit
Admission: RE | Admit: 2023-01-01 | Discharge: 2023-01-01 | Disposition: A | Discharge status: Home / Self Care | Payer: No Typology Code available for payment source | Source: Ambulatory Visit | Attending: Obstetrics & Gynecology | Admitting: Obstetrics & Gynecology

## 2023-01-01 DIAGNOSIS — Z1231 Encounter for screening mammogram for malignant neoplasm of breast: Secondary | ICD-10-CM

## 2023-02-18 ENCOUNTER — Other Ambulatory Visit: Payer: Self-pay

## 2023-02-18 ENCOUNTER — Emergency Department (HOSPITAL_BASED_OUTPATIENT_CLINIC_OR_DEPARTMENT_OTHER)
Admission: EM | Admit: 2023-02-18 | Discharge: 2023-02-19 | Disposition: A | Discharge status: Home / Self Care | Payer: No Typology Code available for payment source | Attending: Emergency Medicine | Admitting: Emergency Medicine

## 2023-02-18 ENCOUNTER — Emergency Department (HOSPITAL_BASED_OUTPATIENT_CLINIC_OR_DEPARTMENT_OTHER): Payer: No Typology Code available for payment source

## 2023-02-18 DIAGNOSIS — I10 Essential (primary) hypertension: Secondary | ICD-10-CM | POA: Insufficient documentation

## 2023-02-18 DIAGNOSIS — K429 Umbilical hernia without obstruction or gangrene: Secondary | ICD-10-CM | POA: Insufficient documentation

## 2023-02-18 LAB — CBC
HCT: 43.7 % (ref 36.0–46.0)
Hemoglobin: 14.8 g/dL (ref 12.0–15.0)
MCH: 29 pg (ref 26.0–34.0)
MCHC: 33.9 g/dL (ref 30.0–36.0)
MCV: 85.5 fL (ref 80.0–100.0)
Platelets: 234 10*3/uL (ref 150–400)
RBC: 5.11 MIL/uL (ref 3.87–5.11)
RDW: 14.3 % (ref 11.5–15.5)
WBC: 8.1 10*3/uL (ref 4.0–10.5)
nRBC: 0 % (ref 0.0–0.2)

## 2023-02-18 LAB — URINALYSIS, ROUTINE W REFLEX MICROSCOPIC
Bilirubin Urine: NEGATIVE
Glucose, UA: NEGATIVE mg/dL
Ketones, ur: 15 mg/dL — AB
Nitrite: NEGATIVE
Specific Gravity, Urine: 1.028 (ref 1.005–1.030)
pH: 6.5 (ref 5.0–8.0)

## 2023-02-18 LAB — COMPREHENSIVE METABOLIC PANEL
ALT: 120 U/L — ABNORMAL HIGH (ref 0–44)
AST: 64 U/L — ABNORMAL HIGH (ref 15–41)
Albumin: 4.6 g/dL (ref 3.5–5.0)
Alkaline Phosphatase: 58 U/L (ref 38–126)
Anion gap: 11 (ref 5–15)
BUN: 14 mg/dL (ref 6–20)
CO2: 29 mmol/L (ref 22–32)
Calcium: 9.5 mg/dL (ref 8.9–10.3)
Chloride: 102 mmol/L (ref 98–111)
Creatinine, Ser: 0.69 mg/dL (ref 0.44–1.00)
GFR, Estimated: >60 mL/min (ref >60)
Glucose, Bld: 106 mg/dL — ABNORMAL HIGH (ref 70–99)
Potassium: 3.9 mmol/L (ref 3.5–5.1)
Sodium: 142 mmol/L (ref 135–145)
Total Bilirubin: 0.6 mg/dL (ref 0.3–1.2)
Total Protein: 7.6 g/dL (ref 6.5–8.1)

## 2023-02-18 LAB — LIPASE, BLOOD: Lipase: <10 U/L — ABNORMAL LOW (ref 11–51)

## 2023-02-18 LAB — PREGNANCY, URINE: Preg Test, Ur: NEGATIVE

## 2023-02-18 MED ORDER — MORPHINE SULFATE (PF) 2 MG/ML IV SOLN
2.0000 mg | Freq: Once | INTRAVENOUS | Status: AC
  Administered 2023-02-19: 2 mg via INTRAVENOUS
  Filled 2023-02-18: qty 1

## 2023-02-18 MED ORDER — SODIUM CHLORIDE 0.9 % IV BOLUS
1000.0000 mL | Freq: Once | INTRAVENOUS | Status: AC
  Administered 2023-02-19: 1000 mL via INTRAVENOUS

## 2023-02-18 MED ORDER — ONDANSETRON HCL 4 MG/2ML IJ SOLN
4.0000 mg | Freq: Once | INTRAMUSCULAR | Status: AC
  Administered 2023-02-19: 4 mg via INTRAVENOUS
  Filled 2023-02-18: qty 2

## 2023-02-18 MED ORDER — IOHEXOL 300 MG/ML  SOLN
100.0000 mL | Freq: Once | INTRAMUSCULAR | Status: AC | PRN
  Administered 2023-02-18: 85 mL via INTRAVENOUS

## 2023-02-18 NOTE — ED Triage Notes (Signed)
Abdo pain x 3 week. Getting worse Middle abd Some nausea and 1 episode of vomiting. Was seen by PCP and suggested ed for [place] Hx cholecystectomy

## 2023-02-18 NOTE — ED Provider Notes (Signed)
[facility] EMERGENCY [facility] Emergency Department Provider Note MRN:  [id number]  Arrival date & time: 02/19/23     Chief Complaint   Abdominal Pain   History of Present Illness   Taylor Lopez is a 42 y.o. year-old female with a history of hypertension presenting to the ED with chief complaint of abdominal pain.  3 weeks of periumbilical abdominal pain, constant, getting worse, associated with nausea vomiting today.  No fever, normal bowel movements, no vaginal bleeding or discharge.  Review of Systems  A thorough review of systems was obtained and all systems are negative except as noted in the HPI and PMH.   Patient's Health History    Past Medical History:  Diagnosis Date   Hypertension    Postpartum only   Late prenatal care     Past Surgical History:  Procedure Laterality Date   LAPAROSCOPIC CHOLECYSTECTOMY      Family History  Problem Relation Age of Onset   Hypertension Mother    Breast cancer Maternal Grandmother    Hypertension Maternal Grandmother    Diabetes Maternal Grandmother     Social History   Socioeconomic History   Marital status: Single    Spouse name: Not on file   Number of children: 5   Years of education: Not on file   Highest education level: 8th grade  Occupational History   Not on file  Tobacco Use   Smoking status: Never   Smokeless tobacco: Never  Vaping Use   Vaping Use: Never used  Substance and Sexual Activity   Alcohol use: Not Currently    Comment: occasionally   Drug use: No   Sexual activity: Yes    Birth control/protection: None, Implant, Condom  Other Topics Concern   Not on file  Social History Narrative   Not on file   Social Determinants of Health   Financial Resource Strain: Not on file  Food Insecurity: No Food Insecurity (12/26/2021)   Hunger Vital Sign    Worried About Running Out of Food in the Last Year: Never true    Ran Out of Food in the Last Year: Never true  Transportation Needs: No  Transportation Needs (12/26/2021)   PRAPARE - Administrator, Civil Service (Medical): No    Lack of Transportation (Non-Medical): No  Physical Activity: Not on file  Stress: Not on file  Social Connections: Not on file  Intimate Partner Violence: Not on file     Physical Exam   Vitals:   02/19/23 0145 02/19/23 0200  BP: (!) 111/55 109/62  Pulse: 69 75  Resp:    Temp:    SpO2: 97% 96%    CONSTITUTIONAL: Well-appearing, NAD NEURO/PSYCH:  Alert and oriented x 3, no focal deficits EYES:  eyes equal and reactive ENT/NECK:  no LAD, no JVD CARDIO: Regular rate, well-perfused, normal S1 and S2 PULM:  CTAB no wheezing or rhonchi GI/GU:  non-distended, non-tender MSK/SPINE:  No gross deformities, no edema SKIN:  no rash, atraumatic   *Additional and/or pertinent findings included in MDM below  Diagnostic and Interventional Summary    EKG Interpretation  Date/Time:    Ventricular Rate:    PR Interval:    QRS Duration:   QT Interval:    QTC Calculation:   R Axis:     Text Interpretation:         Labs Reviewed  LIPASE, BLOOD - Abnormal; Notable for the following components:      Result Value  Lipase <10 (*)    All other components within normal limits  COMPREHENSIVE METABOLIC PANEL - Abnormal; Notable for the following components:   Glucose, Bld 106 (*)    AST 64 (*)    ALT 120 (*)    All other components within normal limits  URINALYSIS, ROUTINE W REFLEX MICROSCOPIC - Abnormal; Notable for the following components:   APPearance HAZY (*)    Hgb urine dipstick TRACE (*)    Ketones, ur 15 (*)    Protein, ur TRACE (*)    Leukocytes,Ua MODERATE (*)    Bacteria, UA RARE (*)    All other components within normal limits  CBC  PREGNANCY, URINE    CT ABDOMEN PELVIS W CONTRAST  Final Result      Medications  sodium chloride 0.9 % bolus 1,000 mL (0 mLs Intravenous Stopped 02/19/23 0126)  ondansetron (ZOFRAN) injection 4 mg (4 mg Intravenous Given 02/19/23  0022)  morphine (PF) 2 MG/ML injection 2 mg (2 mg Intravenous Given 02/19/23 0022)  iohexol (OMNIPAQUE) 300 MG/ML solution 100 mL (85 mLs Intravenous Contrast Given 02/18/23 2357)     Procedures  /  Critical Care Procedures  ED Course and Medical Decision Making  Initial Impression and Ddx Differential diagnosis includes appendicitis, small bowel obstruction, diverticulitis, perforated viscus.  Past medical/surgical history that increases complexity of ED encounter: None  Interpretation of Diagnostics I personally reviewed the laboratory assessment and my interpretation is as follows: No significant blood count or electrolyte disturbance  CT imaging without acute process, there is comment on fat-containing hernias near the umbilicus which correlates with patient's focal location of pain.  Patient Reassessment and Ultimate Disposition/Management     Patient continues to look well, she is feeling better, normal vital signs.  Suspect symptomatic fat-containing hernia based on CT and exam, appropriate for discharge.  Patient management required discussion with the following services or consulting groups:  None  Complexity of Problems Addressed Acute illness or injury that poses threat of life of bodily function  Additional Data Reviewed and Analyzed Further history obtained from: Further history from spouse/family member  Additional Factors Impacting ED Encounter Risk Prescriptions and Use of parenteral controlled substances  Elmer Sow. Pilar Plate, MD [facility] Emergency Medicine [facility] [email or web address]  Final Clinical Impressions(s) / ED Diagnoses     ICD-10-CM   1. Umbilical hernia without obstruction and without gangrene  K42.9       ED Discharge Orders          Ordered    naproxen (NAPROSYN) 500 MG tablet  2 times daily        02/19/23 0213             Discharge Instructions Discussed with and Provided to Patient:     Discharge  Instructions      You were evaluated in the Emergency Department and after careful evaluation, we did not find any emergent condition requiring admission or further testing in the hospital.  Your exam/testing today is overall reassuring.  Symptoms likely due to a small fat-containing hernia near the bellybutton.  This is not dangerous but can be painful.  Recommend use of the Naprosyn anti-inflammatory twice daily and follow-up with the general surgeons if not improving over the next several days.  Please return to the Emergency Department if you experience any worsening of your condition.   Thank you for allowing [place] to be a part of your care.       Sabas Sous,  MD 02/19/23 1610

## 2023-02-19 ENCOUNTER — Emergency Department (HOSPITAL_BASED_OUTPATIENT_CLINIC_OR_DEPARTMENT_OTHER): Payer: No Typology Code available for payment source

## 2023-02-19 MED ORDER — NAPROXEN 500 MG PO TABS: 500.0000 mg | ORAL_TABLET | Freq: Two times a day (BID) | ORAL | 0 refills | Status: DC

## 2023-02-19 NOTE — Discharge Instructions (Signed)
You were evaluated in the Emergency Department and after careful evaluation, we did not find any emergent condition requiring admission or further testing in the hospital.  Your exam/testing today is overall reassuring.  Symptoms likely due to a small fat-containing hernia near the bellybutton.  This is not dangerous but can be painful.  Recommend use of the Naprosyn anti-inflammatory twice daily and follow-up with the general surgeons if not improving over the next several days.  Please return to the Emergency Department if you experience any worsening of your condition.   Thank you for allowing [place] to be a part of your care.

## 2023-03-24 ENCOUNTER — Ambulatory Visit: Payer: Self-pay | Admitting: General Surgery

## 2023-03-25 ENCOUNTER — Other Ambulatory Visit: Payer: Self-pay

## 2023-03-25 ENCOUNTER — Encounter (HOSPITAL_COMMUNITY): Payer: Self-pay | Admitting: General Surgery

## 2023-03-25 NOTE — Progress Notes (Signed)
SDW call  Patient was given pre-op instructions over the phone. Patient verbalized understanding of instructions provided. [id number] interpreter, [place], #[id number]     PCP - [facility] Dept Cardiologist -  Pulmonary:    PPM/ICD - Denies  Chest x-ray - n/a EKG -  n/a Stress Test - ECHO -  Cardiac Cath -   Sleep Study/sleep apnea/CPAP: denies  Non-diabetic   Blood Thinner Instructions: denies Aspirin Instructions:denies   ERAS Protcol - Yes, clear fluids until 1215 PRE-SURGERY Ensure or G2-    COVID TEST- n/a    Anesthesia review: No   Patient denies shortness of breath, fever, cough and chest pain over the phone call  Your procedure is scheduled on Thursday March 26, 2023  Report to [facility] Main Entrance "A" at 1245 PM then check in with the Admitting office.  Call this number if you have problems the morning of surgery:  [phone]   If you have any questions prior to your surgery date call [phone]: Open Monday-Friday 8am-4pm If you experience any cold or flu symptoms such as cough, fever, chills, shortness of breath, etc. between now and your scheduled surgery, please notify us at the above number    Remember:  Do not eat after midnight the night before your surgery  You may drink clear liquids until 1215    the morning of your surgery.   Clear liquids allowed are: Water, Non-Citrus Juices (without pulp), Carbonated Beverages, Clear Tea, Black Coffee ONLY (NO MILK, CREAM OR POWDERED CREAMER of any kind), and Gatorade   Take these medicines as needed the morning of surgery with A SIP OF WATER: Tylenol  As of today, STOP taking any Aspirin (unless otherwise instructed by your surgeon) Aleve, Naproxen, Ibuprofen, Motrin, Advil, Goody's, BC's, all herbal medications, fish oil, and all vitamins.

## 2023-03-26 ENCOUNTER — Encounter (HOSPITAL_COMMUNITY)
Admission: RE | Disposition: A | Discharge status: Home / Self Care | Payer: Self-pay | Source: Home / Self Care | Attending: General Surgery

## 2023-03-26 ENCOUNTER — Other Ambulatory Visit: Payer: Self-pay

## 2023-03-26 ENCOUNTER — Encounter (HOSPITAL_COMMUNITY): Payer: Self-pay | Admitting: General Surgery

## 2023-03-26 ENCOUNTER — Ambulatory Visit (HOSPITAL_COMMUNITY): Payer: No Typology Code available for payment source | Admitting: Anesthesiology

## 2023-03-26 ENCOUNTER — Ambulatory Visit (HOSPITAL_COMMUNITY)
Admission: RE | Admit: 2023-03-26 | Discharge: 2023-03-26 | Disposition: A | Discharge status: Home / Self Care | Payer: No Typology Code available for payment source | Attending: General Surgery | Admitting: General Surgery

## 2023-03-26 ENCOUNTER — Ambulatory Visit (HOSPITAL_BASED_OUTPATIENT_CLINIC_OR_DEPARTMENT_OTHER): Payer: No Typology Code available for payment source | Admitting: Anesthesiology

## 2023-03-26 DIAGNOSIS — K429 Umbilical hernia without obstruction or gangrene: Secondary | ICD-10-CM | POA: Insufficient documentation

## 2023-03-26 DIAGNOSIS — I10 Essential (primary) hypertension: Secondary | ICD-10-CM | POA: Insufficient documentation

## 2023-03-26 DIAGNOSIS — Z9049 Acquired absence of other specified parts of digestive tract: Secondary | ICD-10-CM | POA: Insufficient documentation

## 2023-03-26 DIAGNOSIS — K760 Fatty (change of) liver, not elsewhere classified: Secondary | ICD-10-CM | POA: Insufficient documentation

## 2023-03-26 DIAGNOSIS — Z5331 Laparoscopic surgical procedure converted to open procedure: Secondary | ICD-10-CM | POA: Insufficient documentation

## 2023-03-26 HISTORY — PX: UMBILICAL HERNIA REPAIR: SHX196

## 2023-03-26 HISTORY — PX: LAPAROSCOPY: SHX197

## 2023-03-26 LAB — CBC
HCT: 41.7 % (ref 36.0–46.0)
Hemoglobin: 14.3 g/dL (ref 12.0–15.0)
MCH: 29.7 pg (ref 26.0–34.0)
MCHC: 34.3 g/dL (ref 30.0–36.0)
MCV: 86.5 fL (ref 80.0–100.0)
Platelets: 199 10*3/uL (ref 150–400)
RBC: 4.82 MIL/uL (ref 3.87–5.11)
RDW: 13.8 % (ref 11.5–15.5)
WBC: 8.5 10*3/uL (ref 4.0–10.5)
nRBC: 0 % (ref 0.0–0.2)

## 2023-03-26 LAB — POCT PREGNANCY, URINE: Preg Test, Ur: NEGATIVE

## 2023-03-26 SURGERY — LAPAROSCOPY, DIAGNOSTIC
Anesthesia: General | Site: Abdomen

## 2023-03-26 MED ORDER — PROPOFOL 10 MG/ML IV BOLUS
INTRAVENOUS | Status: AC
  Filled 2023-03-26: qty 20

## 2023-03-26 MED ORDER — KETOROLAC TROMETHAMINE 30 MG/ML IJ SOLN
INTRAMUSCULAR | Status: AC
  Filled 2023-03-26: qty 1

## 2023-03-26 MED ORDER — ACETAMINOPHEN 500 MG PO TABS: 1000.0000 mg | ORAL_TABLET | Freq: Four times a day (QID) | ORAL | Status: AC

## 2023-03-26 MED ORDER — AMISULPRIDE (ANTIEMETIC) 5 MG/2ML IV SOLN
INTRAVENOUS | Status: AC
  Filled 2023-03-26: qty 4

## 2023-03-26 MED ORDER — SCOPOLAMINE 1 MG/3DAYS TD PT72
MEDICATED_PATCH | TRANSDERMAL | Status: DC | PRN
  Administered 2023-03-26: 1 via TRANSDERMAL

## 2023-03-26 MED ORDER — EPHEDRINE SULFATE-NACL 50-0.9 MG/10ML-% IV SOSY
PREFILLED_SYRINGE | INTRAVENOUS | Status: DC | PRN
  Administered 2023-03-26: 5 mg via INTRAVENOUS

## 2023-03-26 MED ORDER — ONDANSETRON HCL 4 MG/2ML IJ SOLN
INTRAMUSCULAR | Status: AC
  Filled 2023-03-26: qty 2

## 2023-03-26 MED ORDER — AMISULPRIDE (ANTIEMETIC) 5 MG/2ML IV SOLN
10.0000 mg | Freq: Once | INTRAVENOUS | Status: AC | PRN
  Administered 2023-03-26: 10 mg via INTRAVENOUS

## 2023-03-26 MED ORDER — OXYCODONE HCL 5 MG PO TABS: 5.0000 mg | ORAL_TABLET | Freq: Once | ORAL | Status: DC | PRN

## 2023-03-26 MED ORDER — MIDAZOLAM HCL 5 MG/5ML IJ SOLN
INTRAMUSCULAR | Status: DC | PRN
  Administered 2023-03-26: 2 mg via INTRAVENOUS

## 2023-03-26 MED ORDER — ORAL CARE MOUTH RINSE: 15.0000 mL | Freq: Once | OROMUCOSAL | Status: AC

## 2023-03-26 MED ORDER — BUPIVACAINE LIPOSOME 1.3 % IJ SUSP
INTRAMUSCULAR | Status: AC
  Filled 2023-03-26: qty 20

## 2023-03-26 MED ORDER — LIDOCAINE 2% (20 MG/ML) 5 ML SYRINGE
INTRAMUSCULAR | Status: AC
  Filled 2023-03-26: qty 5

## 2023-03-26 MED ORDER — CHLORHEXIDINE GLUCONATE CLOTH 2 % EX PADS: 6.0000 | MEDICATED_PAD | Freq: Once | CUTANEOUS | Status: DC

## 2023-03-26 MED ORDER — FENTANYL CITRATE (PF) 250 MCG/5ML IJ SOLN
INTRAMUSCULAR | Status: DC | PRN
  Administered 2023-03-26 (×2): 50 ug via INTRAVENOUS

## 2023-03-26 MED ORDER — FENTANYL CITRATE (PF) 250 MCG/5ML IJ SOLN
INTRAMUSCULAR | Status: AC
  Filled 2023-03-26: qty 5

## 2023-03-26 MED ORDER — CEFAZOLIN SODIUM-DEXTROSE 2-4 GM/100ML-% IV SOLN
2.0000 g | INTRAVENOUS | Status: AC
  Administered 2023-03-26: 2 g via INTRAVENOUS

## 2023-03-26 MED ORDER — SUGAMMADEX SODIUM 200 MG/2ML IV SOLN
INTRAVENOUS | Status: DC | PRN
  Administered 2023-03-26: 200 mg via INTRAVENOUS

## 2023-03-26 MED ORDER — FENTANYL CITRATE (PF) 100 MCG/2ML IJ SOLN
25.0000 ug | INTRAMUSCULAR | Status: DC | PRN
  Administered 2023-03-26 (×2): 50 ug via INTRAVENOUS

## 2023-03-26 MED ORDER — SCOPOLAMINE 1 MG/3DAYS TD PT72
MEDICATED_PATCH | TRANSDERMAL | Status: AC
  Filled 2023-03-26: qty 1

## 2023-03-26 MED ORDER — LIDOCAINE 2% (20 MG/ML) 5 ML SYRINGE
INTRAMUSCULAR | Status: DC | PRN
  Administered 2023-03-26: 100 mg via INTRAVENOUS

## 2023-03-26 MED ORDER — KETOROLAC TROMETHAMINE 30 MG/ML IJ SOLN
INTRAMUSCULAR | Status: DC | PRN
  Administered 2023-03-26: 30 mg via INTRAVENOUS

## 2023-03-26 MED ORDER — ROCURONIUM BROMIDE 10 MG/ML (PF) SYRINGE
PREFILLED_SYRINGE | INTRAVENOUS | Status: DC | PRN
  Administered 2023-03-26: 60 mg via INTRAVENOUS

## 2023-03-26 MED ORDER — BUPIVACAINE-EPINEPHRINE (PF) 0.25% -1:200000 IJ SOLN
INTRAMUSCULAR | Status: AC
  Filled 2023-03-26: qty 30

## 2023-03-26 MED ORDER — LACTATED RINGERS IV SOLN: INTRAVENOUS | Status: DC

## 2023-03-26 MED ORDER — BUPIVACAINE-EPINEPHRINE 0.5% -1:200000 IJ SOLN
INTRAMUSCULAR | Status: DC | PRN
  Administered 2023-03-26: 30 mL

## 2023-03-26 MED ORDER — OXYCODONE HCL 5 MG PO TABS: 5.0000 mg | ORAL_TABLET | Freq: Four times a day (QID) | ORAL | 0 refills | Status: AC | PRN

## 2023-03-26 MED ORDER — ONDANSETRON HCL 4 MG/2ML IJ SOLN
INTRAMUSCULAR | Status: DC | PRN
  Administered 2023-03-26: 4 mg via INTRAVENOUS

## 2023-03-26 MED ORDER — FENTANYL CITRATE (PF) 100 MCG/2ML IJ SOLN
INTRAMUSCULAR | Status: AC
  Filled 2023-03-26: qty 2

## 2023-03-26 MED ORDER — 0.9 % SODIUM CHLORIDE (POUR BTL) OPTIME
TOPICAL | Status: DC | PRN
  Administered 2023-03-26: 1000 mL

## 2023-03-26 MED ORDER — CEFAZOLIN SODIUM-DEXTROSE 2-4 GM/100ML-% IV SOLN
INTRAVENOUS | Status: AC
  Filled 2023-03-26: qty 100

## 2023-03-26 MED ORDER — DEXAMETHASONE SODIUM PHOSPHATE 10 MG/ML IJ SOLN
INTRAMUSCULAR | Status: DC | PRN
  Administered 2023-03-26: 10 mg via INTRAVENOUS

## 2023-03-26 MED ORDER — CHLORHEXIDINE GLUCONATE 0.12 % MT SOLN
OROMUCOSAL | Status: AC
  Administered 2023-03-26: 15 mL via OROMUCOSAL
  Filled 2023-03-26: qty 15

## 2023-03-26 MED ORDER — ACETAMINOPHEN 500 MG PO TABS
1000.0000 mg | ORAL_TABLET | ORAL | Status: AC
  Administered 2023-03-26: 1000 mg via ORAL
  Filled 2023-03-26: qty 2

## 2023-03-26 MED ORDER — OXYCODONE HCL 5 MG/5ML PO SOLN: 5.0000 mg | Freq: Once | ORAL | Status: DC | PRN

## 2023-03-26 MED ORDER — ROCURONIUM BROMIDE 10 MG/ML (PF) SYRINGE
PREFILLED_SYRINGE | INTRAVENOUS | Status: AC
  Filled 2023-03-26: qty 10

## 2023-03-26 MED ORDER — CHLORHEXIDINE GLUCONATE 0.12 % MT SOLN: 15.0000 mL | Freq: Once | OROMUCOSAL | Status: AC

## 2023-03-26 MED ORDER — PROPOFOL 10 MG/ML IV BOLUS
INTRAVENOUS | Status: DC | PRN
  Administered 2023-03-26: 160 mg via INTRAVENOUS
  Administered 2023-03-26: 40 mg via INTRAVENOUS

## 2023-03-26 MED ORDER — BUPIVACAINE LIPOSOME 1.3 % IJ SUSP
INTRAMUSCULAR | Status: DC | PRN
  Administered 2023-03-26: 20 mL

## 2023-03-26 MED ORDER — MIDAZOLAM HCL 2 MG/2ML IJ SOLN
INTRAMUSCULAR | Status: AC
  Filled 2023-03-26: qty 2

## 2023-03-26 MED ORDER — DEXAMETHASONE SODIUM PHOSPHATE 10 MG/ML IJ SOLN
INTRAMUSCULAR | Status: AC
  Filled 2023-03-26: qty 1

## 2023-03-26 SURGICAL SUPPLY — 53 items
ADH SKN CLS APL DERMABOND .7 (GAUZE/BANDAGES/DRESSINGS) ×2
APL PRP STRL LF DISP 70% ISPRP (MISCELLANEOUS) ×2
APPLIER CLIP LOGIC TI 5 (MISCELLANEOUS) IMPLANT
APR CLP MED LRG 33X5 (MISCELLANEOUS)
BAG COUNTER SPONGE SURGICOUNT (BAG) ×1 IMPLANT
BAG SPNG CNTER NS LX DISP (BAG)
BLADE CLIPPER SURG (BLADE) IMPLANT
CANISTER SUCT 3000ML PPV (MISCELLANEOUS) IMPLANT
CHLORAPREP W/TINT 26 (MISCELLANEOUS) ×2 IMPLANT
CLSR STERI-STRIP ANTIMIC 1/2X4 (GAUZE/BANDAGES/DRESSINGS) ×1 IMPLANT
COVER SURGICAL LIGHT HANDLE (MISCELLANEOUS) ×2 IMPLANT
DERMABOND ADVANCED .7 DNX12 (GAUZE/BANDAGES/DRESSINGS) ×2 IMPLANT
DEVICE SECURE STRAP 25 ABSORB (INSTRUMENTS) ×1 IMPLANT
DEVICE TROCAR PUNCTURE CLOSURE (ENDOMECHANICALS) ×2 IMPLANT
DRAPE INCISE IOBAN 66X45 STRL (DRAPES) IMPLANT
DRSG TEGADERM 2-3/8X2-3/4 SM (GAUZE/BANDAGES/DRESSINGS) ×2 IMPLANT
DRSG TEGADERM 4X4.5 CHG (GAUZE/BANDAGES/DRESSINGS) ×1 IMPLANT
ELECT REM PT RETURN 9FT ADLT (ELECTROSURGICAL) ×2
ELECTRODE REM PT RTRN 9FT ADLT (ELECTROSURGICAL) ×2 IMPLANT
GAUZE SPONGE 2X2 8PLY STRL LF (GAUZE/BANDAGES/DRESSINGS) ×1 IMPLANT
GLOVE BIOGEL M STRL SZ7.5 (GLOVE) ×2 IMPLANT
GLOVE INDICATOR 8.0 STRL GRN (GLOVE) ×2 IMPLANT
GOWN STRL REUS W/ TWL LRG LVL3 (GOWN DISPOSABLE) ×4 IMPLANT
GOWN STRL REUS W/ TWL XL LVL3 (GOWN DISPOSABLE) ×2 IMPLANT
GOWN STRL REUS W/TWL LRG LVL3 (GOWN DISPOSABLE) ×4
GOWN STRL REUS W/TWL XL LVL3 (GOWN DISPOSABLE) ×2
IRRIG SUCT STRYKERFLOW 2 WTIP (MISCELLANEOUS)
IRRIGATION SUCT STRKRFLW 2 WTP (MISCELLANEOUS) IMPLANT
KIT BASIN OR (CUSTOM PROCEDURE TRAY) ×2 IMPLANT
KIT TURNOVER KIT B (KITS) ×2 IMPLANT
MARKER SKIN DUAL TIP RULER LAB (MISCELLANEOUS) ×2 IMPLANT
NDL SPNL 22GX3.5 QUINCKE BK (NEEDLE) ×1 IMPLANT
NEEDLE SPNL 22GX3.5 QUINCKE BK (NEEDLE) ×2 IMPLANT
NS IRRIG 1000ML POUR BTL (IV SOLUTION) ×2 IMPLANT
PAD ARMBOARD 7.5X6 YLW CONV (MISCELLANEOUS) ×4 IMPLANT
PENCIL SMOKE EVACUATOR (MISCELLANEOUS) ×2 IMPLANT
SCISSORS LAP 5X35 DISP (ENDOMECHANICALS) IMPLANT
SET TUBE SMOKE EVAC HIGH FLOW (TUBING) ×2 IMPLANT
SHEARS HARMONIC ACE PLUS 36CM (ENDOMECHANICALS) IMPLANT
SLEEVE Z-THREAD 5X100MM (TROCAR) ×2 IMPLANT
SUT MNCRL AB 4-0 PS2 18 (SUTURE) ×2 IMPLANT
SUT NOVA NAB GS-21 1 T12 (SUTURE) ×2 IMPLANT
SUT VIC AB 3-0 SH 18 (SUTURE) ×1 IMPLANT
TOWEL GREEN STERILE (TOWEL DISPOSABLE) ×1 IMPLANT
TOWEL GREEN STERILE FF (TOWEL DISPOSABLE) ×2 IMPLANT
TRAY FOLEY W/BAG SLVR 16FR (SET/KITS/TRAYS/PACK)
TRAY FOLEY W/BAG SLVR 16FR ST (SET/KITS/TRAYS/PACK) IMPLANT
TRAY LAPAROSCOPIC MC (CUSTOM PROCEDURE TRAY) ×2 IMPLANT
TROCAR 11X100 Z THREAD (TROCAR) IMPLANT
TROCAR BALLN 12MMX100 BLUNT (TROCAR) IMPLANT
TROCAR Z-THREAD OPTICAL 5X100M (TROCAR) ×1 IMPLANT
WARMER LAPAROSCOPE (MISCELLANEOUS) ×2 IMPLANT
WATER STERILE IRR 1000ML POUR (IV SOLUTION) ×2 IMPLANT

## 2023-03-26 NOTE — Transfer of Care (Signed)
Immediate Anesthesia Transfer of Care Note  Patient: Taylor Lopez  Procedure(s) Performed: LAPAROSCOPIC ASSISTED UMBILICAL HERNIA REPAIR (Abdomen)  Patient Location: PACU  Anesthesia Type:General  Level of Consciousness: drowsy, patient cooperative, and responds to stimulation  Airway & Oxygen Therapy: Patient Spontanous Breathing and Patient connected to face mask oxygen  Post-op Assessment: Report given to RN, Post -op Vital signs reviewed and stable, Patient moving all extremities X 4, and Patient able to stick tongue midline  Post vital signs: Reviewed and stable  Last Vitals:  Vitals Value Taken Time  BP 139/68   Temp 99.0   Pulse 113 03/26/23 1640  Resp 30 03/26/23 1640  SpO2 96 % 03/26/23 1640  Vitals shown include unfiled device data.  Last Pain:  Vitals:   03/26/23 1328  TempSrc:   PainSc: 4       Patients Stated Pain Goal: 0 (03/26/23 1328)  Complications: No notable events documented.

## 2023-03-26 NOTE — H&P (Signed)
REFERRING PHYSICIAN: Room, Emergency  PROVIDER: Esaul Dorwart Sherril Cong, MD  MRN: [id number] DOB: 1981/01/28 DATE OF ENCOUNTER: 03/11/2023  Subjective   Chief Complaint: New Consultation ( Umbilical hernia eval )   History of Present Illness: Taylor Lopez is a 42 y.o. female who is seen today as an office consultation at the request of Dr. Room for evaluation of New Consultation ( Umbilical hernia eval ) .  Patient is accompanied by her son who functions as her Nurse, learning disability. She refused a Nurse, learning disability as well as video and/or telephonic interpreter. She has had a prior laparoscopic cholecystectomy. She states that she has been having umbilical pain for a little bit over a month. Initially started intermittently but now it is constant. Does cause some nausea. Does not radiate. She ended up going to the emergency room where she was evaluated with a CT and labs. She was found to have some fatty liver as well as a fat-containing umbilical hernia. She is having daily bowel movements. No vomiting. No fever or chills. She does endorse may be some bloating after eating. She points specifically to her umbilicus. She has tried the medication that the ER gave her which helps but does not control her discomfort entirely.    Review of Systems: A complete review of systems was obtained from the patient. I have reviewed this information and discussed as appropriate with the patient. See HPI as well for other ROS.  ROS   Medical History: History reviewed. No pertinent past medical history.  There is no problem list on file for this patient.  Past Surgical History:  Procedure Laterality Date  CHOLECYSTECTOMY    No Known Allergies  Current Outpatient Medications on File Prior to Visit  Medication Sig Dispense Refill  naproxen (NAPROSYN) 500 MG tablet Take 500 mg by mouth 2 (two) times daily   No current facility-administered medications on file prior to visit.   Family History  Problem Relation Age  of Onset  High blood pressure (Hypertension) Mother  Breast cancer Maternal Grandmother    Social History   Tobacco Use  Smoking Status Never  Smokeless Tobacco Never    Social History   Socioeconomic History  Marital status: Single  Tobacco Use  Smoking status: Never  Smokeless tobacco: Never  Vaping Use  Vaping status: Never Used  Substance and Sexual Activity  Alcohol use: Not Currently  Drug use: Never   Social Determinants of Health   Food Insecurity: No Food Insecurity (12/26/2021)  Received from [facility]  Hunger Vital Sign  Worried About Running Out of Food in the Last Year: Never true  Ran Out of Food in the Last Year: Never true  Transportation Needs: No Transportation Needs (12/26/2021)  [facility] from [facility]  Lack of Transportation (Medical): No  Lack of Transportation (Non-Medical): No   Objective:   Vitals:  03/11/23 0900  BP: (!) 144/85  Pulse: 76  Temp: 36.8 C (98.3 F)  SpO2: 98%  Weight: 81.7 kg (180 lb 3.2 oz)  Height: 160 cm (5\' 3" )  PainSc: 6   Body mass index is 31.92 kg/m.  Constitutional: NAD; conversant; no deformities; mild obesity Eyes: Moist conjunctiva; no lid lag; anicteric; PERRL Neck: Trachea midline; no thyromegaly Lungs: Normal respiratory effort; no tactile fremitus CV: RRR; no palpable thrills; no pitting edema GI: Abd soft, nondistended, old trocar scars, small fat-containing umbilical hernia. Tender to palpation. No cellulitis or induration; no palpable hepatosplenomegaly MSK: Normal gait; no clubbing/cyanosis Psychiatric: Appropriate affect; alert  and oriented x3 Lymphatic: No palpable cervical or axillary lymphadenopathy Skin: No rash, lesions or jaundice  Labs, Imaging and Diagnostic Testing: ED note 02/18/23  CT a/p 02/19/23 Other: No free air or free fluid in the abdomen. Small periumbilical hernias containing fat.  Musculoskeletal: No acute or significant osseous  findings.  IMPRESSION: 1. No acute process demonstrated in the abdomen or pelvis. No evidence of bowel obstruction or inflammation. 2. Fatty infiltration of the liver.  Labs 02/18/23 - cmet ok ast 64, alt 120; cbc ok  Assessment and Plan:    Diagnoses and all orders for this visit:  Incisional hernia, without obstruction or gangrene - acetaminophen (TYLENOL) 500 MG tablet; Take 2 tablets (1,000 mg total) by mouth every 6 (six) hours as needed for Pain for up to 10 days  Hepatic steatosis  Language barrier    We discussed the etiology of ventral hernias. We discussed the signs and symptoms of incarceration and strangulation. The patient was given educational material in [place]. I also drew diagrams.  We discussed nonoperative and operative management. With respect to operative management, we discussed both open repair laparoscopic assisted (primary muscle repair with mesh underlay). We discussed the pros and cons of each approach. I discussed the typical aftercare with each procedure and how each procedure differs.  We discussed the risk and benefits of surgery including but not limited to bleeding, infection, injury to surrounding structures, hernia recurrence, mesh complications, hematoma/seroma formation, need to convert to an open procedure, blood clot formation, post operative ileus, general anesthesia risk, long-term abdominal pain. We discussed that this procedure can be quite uncomfortable and difficult to recover from based on how the mesh is secured to the abdominal wall. We discussed the importance of avoiding heavy lifting and straining for a period of 6 weeks. We discussed postoperative pain control with nonopioid analgesia. We discussed the typical recovery.  The patient has elected to laparoscopic assisted repair of umbilical hernia with mesh  This patient encounter took 35 minutes today to perform the following: take history, perform exam, review outside records,  interpret imaging, counsel the patient on their diagnosis and document encounter, findings & plan in the EHR  No follow-ups on file.  Errica Dutil Sherril Cong, MD  General, Minimally Invasive, & Bariatric Surgery

## 2023-03-26 NOTE — Discharge Instructions (Signed)
POST OP INSTRUCTIONS Always review your discharge instruction sheet given to you by the facility where your surgery was performed. IF YOU HAVE DISABILITY OR FAMILY LEAVE FORMS, YOU MUST BRING THEM TO THE OFFICE FOR PROCESSING.   DO NOT GIVE THEM TO YOUR DOCTOR.  PAIN CONTROL  First take acetaminophen (Tylenol) AND/or ibuprofen (Advil) to control your pain after surgery.  Follow directions on package.  Taking acetaminophen (Tylenol) and/or ibuprofen (Advil) regularly after surgery will help to control your pain and lower the amount of prescription pain medication you may need.  You should not take more than 3,000 mg (3 grams) of acetaminophen (Tylenol) in 24 hours.  You should not take ibuprofen (Advil), aleve, motrin, naprosyn or other NSAIDS if you have a history of stomach ulcers or chronic kidney disease.  A prescription for pain medication may be given to you upon discharge.  Take your pain medication as prescribed, if you still have uncontrolled pain after taking acetaminophen (Tylenol) or ibuprofen (Advil). Use ice packs to help control pain. If you need a refill on your pain medication, please contact your pharmacy.  They will contact our office to request authorization. Prescriptions will not be filled after 5pm or on week-ends.  HOME MEDICATIONS Take your usually prescribed medications unless otherwise directed.  DIET You should follow a light diet the first few days after arrival home.  Be sure to include lots of fluids daily. Avoid fatty, fried foods.   CONSTIPATION It is common to experience some constipation after surgery and if you are taking pain medication.  Increasing fluid intake and taking a stool softener (such as Colace) will usually help or prevent this problem from occurring.  A mild laxative (Milk of Magnesia or Miralax) should be taken according to package instructions if there are no bowel movements after 48 hours.  WOUND/INCISION CARE Most patients will experience  some swelling and bruising in the area of the incisions.  Ice packs will help.  Swelling and bruising can take several days to resolve.  Unless discharge instructions indicate otherwise, follow guidelines below  STERI-STRIPS - you may remove your outer bandages 48 hours after surgery, and you may shower at that time.  You have steri-strips (small skin tapes) in place directly over the incision.  These strips should be left on the skin for 7-10 days.   DERMABOND/SKIN GLUE - you may shower in 24 hours.  The glue will flake off over the next 2-3 weeks. Any sutures or staples will be removed at the office during your follow-up visit.  ACTIVITIES You may resume regular (light) daily activities beginning the next day--such as daily self-care, walking, climbing stairs--gradually increasing activities as tolerated.  You may have sexual intercourse when it is comfortable.  Refrain from any heavy lifting or straining until approved by your doctor. You may drive when you are no longer taking prescription pain medication, you can comfortably wear a seatbelt, and you can safely maneuver your car and apply brakes.  FOLLOW-UP You should see your doctor in the office for a follow-up appointment approximately 2-3 weeks after your surgery.  You should have been given your post-op/follow-up appointment when your surgery was scheduled.  If you did not receive a post-op/follow-up appointment, make sure that you call for this appointment within a day or two after you arrive home to insure a convenient appointment time.  OTHER INSTRUCTIONS   WHEN TO CALL YOUR DOCTOR: Fever over 101.0 Inability to urinate Continued bleeding from incision. Increased pain, redness,  or drainage from the incision. Increasing abdominal pain  The clinic staff is available to answer your questions during regular business hours.  Please don't hesitate to call and ask to speak to one of the nurses for clinical concerns.  If you have a medical  emergency, go to the nearest emergency room or call 911.  A surgeon from [facility] is always on call at the hospital. [address], [place], [place]  [address] ? [address] [address], [place], [place]   [address] [phone] ? [phone] ? FAX [phone] Web site: [email or web address]

## 2023-03-26 NOTE — Anesthesia Postprocedure Evaluation (Signed)
Anesthesia Post Note  Patient: Taylor Lopez  Procedure(s) Performed: LAPAROSCOPY DIAGNOSTIC (Abdomen) OPEN REPAIR OF UMBILICAL INCISIONAL HERNIA (Abdomen)     Patient location during evaluation: PACU Anesthesia Type: General Level of consciousness: awake and alert Pain management: pain level controlled Vital Signs Assessment: post-procedure vital signs reviewed and stable Respiratory status: spontaneous breathing, nonlabored ventilation and respiratory function stable Cardiovascular status: blood pressure returned to baseline Postop Assessment: no apparent nausea or vomiting Anesthetic complications: no   No notable events documented.  Last Vitals:  Vitals:   03/26/23 1707 03/26/23 1715  BP:  (!) 140/76  Pulse: (!) 112 (!) 103  Resp: 18 12  Temp:    SpO2: 97% 92%    Last Pain:  Vitals:   03/26/23 1707  TempSrc:   PainSc: 5                  Shanda Howells

## 2023-03-26 NOTE — Op Note (Signed)
03/26/2023  4:34 PM  PATIENT:  Taylor Lopez  42 y.o. female  PRE-OPERATIVE DIAGNOSIS:  INCISIONAL UMBILICAL HERNIA 1.5 CM  POST-OPERATIVE DIAGNOSIS:  INCISIONAL UMBILICAL  HERNIA   PROCEDURE:  Procedure(s): OPEN REPAIR OF UMBILICAL INCISIONAL HERNIA 1.5 CM DIAGNOSTIC LAPAROSCOPY BILATERAL LAPAROSCOPIC TAP BLOCK  SURGEON:  Surgeon(s): Gaynelle Adu, MD  Surgery Resident: Clabe Seal  PGY-4  ANESTHESIA:   general  DRAINS: none   LOCAL MEDICATIONS USED:  MARCAINE    and OTHER exparel  SPECIMEN:  Source of Specimen:  hernia sac/fat   DISPOSITION OF SPECIMEN:   discarded  COUNTS:  YES  INDICATION FOR PROCEDURE: 42 year old female with a previous surgical history of laparoscopic cholecystectomy presenting to the clinic complaining of periumbilical pain.  She was accompanied by her son.  She had been evaluated in the emergency room and underwent CT and labs.  She was found to have a fatty liver and a fat-containing umbilical hernia.  She complained of pain at her umbilicus.  She desired umbilical hernia repair surgery.  Risk and benefits were discussed along with steps of the procedure which were separately documented.  PROCEDURE: Patient was taken to the operating room The patient was placed supine.  After establishing general anesthesia, Left arm tucked with the appropriate padding.  The abdomen was prepped with Chloraprep and draped in standard fashion.  A 5 mm Optiview was used the cannulate the peritoneal cavity in the left upper quadrant below the costal margin.  Pneumoperitoneum was obtained by insufflating CO2, maintaining a maximum pressure of 15 mmHg.  The 5 mm 30-degree laparoscopic was inserted.  There were no  omental adhesions to the anterior abdominal wall in and around the umbilical hernia defect.  A bilateral laparoscopic tap block with a mixture of Exparel and Marcaine was performed for postoperative pain relief.  Her defect was 1.5 cm as measured preoperatively  before the start of the procedure.  Intraoperative fascial defect was the same.  Fascial defect was small I elected to just do a primary repair.  I was concerned about patient's ability to tolerate discomfort from mesh fixation to the abdominal wall so therefore I just did a primary repair A  infraumbilical incision was created thru her old infraumbilical incision. Dissection was carried down to the hernia sac located above the fascia and was mobilized from surrounding structures. Intact fascia was identified circumferentially around the defect. Skin and soft tissue was mobilized from the surface of the fascia in a circumferential manner. A finger sweep was performed underneath the fascia to ensure there were no adhesions to the anterior abdominal wall.  The fascia was then closed primarily with two figure of eight 1-0-novafil sutures and two interrupted 1-0 novafil sutures.  The cavity was irrigated and additional local was infiltrated in the subcutaneous tissue and fascia. The umbilical stalk was then tacked back down to the fascia with one 3-0 vicryl sutures. Hemostasis was confirmed. The soft tissue was irrigated and closed in layers with inverted interrupted 3-0 vicryl sutures for the deep dermis. The skin incision was closed with a 4-0 monocryl subcuticular closure. Dermabond was placed.  Patient was extubated and taken to the recovery room.  All needle, instrument, and sponge counts were correct x 2.  There were no immediate complications.  Hernia Details   Recurrent Hernia Type of repair - primary suture  I was personally present & scrubbed during theentire procedure except for skin closure and immediately available throughout the entire procedure, as documented in my operative note.  PLAN OF CARE: Discharge to home after PACU  PATIENT DISPOSITION:  PACU - hemodynamically stable.   Delay start of Pharmacological VTE agent (>24hrs) due to surgical blood loss or risk of bleeding:  not  applicable  Mary Sella. Andrey Campanile, MD, FACS General, Bariatric, & Minimally Invasive Surgery [facility], [place]

## 2023-03-26 NOTE — Interval H&P Note (Signed)
History and Physical Interval Note:  03/26/2023 3:18 PM  Taylor Lopez  has presented today for surgery, with the diagnosis of UMBILICAL HERNIA.  The various methods of treatment have been discussed with the patient and family. After consideration of risks, benefits and other options for treatment, the patient has consented to  Procedure(s): LAPAROSCOPIC ASSISTED UMBILICAL HERNIA REPAIR WITH MESH (N/A) as a surgical intervention.  The patient's history has been reviewed, patient examined, no change in status, stable for surgery.  I have reviewed the patient's chart and labs.  Questions were answered to the patient's satisfaction.    Discusssed [facility] surgery resident involvement  Gaynelle Adu

## 2023-03-26 NOTE — Anesthesia Preprocedure Evaluation (Signed)
Anesthesia Evaluation  Patient identified by MRN, date of birth, ID band Patient awake    Reviewed: Allergy & Precautions, NPO status , Patient's Chart, lab work & pertinent test results  Airway Mallampati: III  TM Distance: >3 FB Neck ROM: Full    Dental  (+) Dental Advisory Given   Pulmonary neg pulmonary ROS   breath sounds clear to auscultation       Cardiovascular hypertension,  Rhythm:Regular Rate:Normal     Neuro/Psych negative neurological ROS     GI/Hepatic negative GI ROS, Neg liver ROS,,,  Endo/Other  negative endocrine ROS    Renal/GU negative Renal ROS     Musculoskeletal   Abdominal   Peds  Hematology negative hematology ROS (+)   Anesthesia Other Findings   Reproductive/Obstetrics                             Anesthesia Physical Anesthesia Plan  ASA: 2  Anesthesia Plan:    Post-op Pain Management: Tylenol PO (pre-op)*, Toradol IV (intra-op)* and Minimal or no pain anticipated   Induction: Intravenous  PONV Risk Score and Plan: 2 and Dexamethasone, Ondansetron and Treatment may vary due to age or medical condition  Airway Management Planned: Oral ETT  Additional Equipment: None  Intra-op Plan:   Post-operative Plan: Extubation in OR  Informed Consent: I have reviewed the patients History and Physical, chart, labs and discussed the procedure including the risks, benefits and alternatives for the proposed anesthesia with the patient or authorized representative who has indicated his/her understanding and acceptance.     Dental advisory given  Plan Discussed with: CRNA  Anesthesia Plan Comments:        Anesthesia Quick Evaluation

## 2023-03-26 NOTE — Anesthesia Procedure Notes (Signed)
Procedure Name: Intubation Date/Time: 03/26/2023 3:43 PM  Performed by: Cy Blamer, CRNAPre-anesthesia Checklist: Patient identified, Emergency Drugs available, Suction available and Patient being monitored Patient Re-evaluated:Patient Re-evaluated prior to induction Oxygen Delivery Method: Circle system utilized Preoxygenation: Pre-oxygenation with 100% oxygen Induction Type: IV induction Ventilation: Mask ventilation without difficulty Laryngoscope Size: Mac and 3 Grade View: Grade III Tube type: Oral Tube size: 7.0 mm Number of attempts: 1 Airway Equipment and Method: Stylet and Oral airway Placement Confirmation: ETT inserted through vocal cords under direct vision, positive ETCO2 and breath sounds checked- equal and bilateral Secured at: 22 cm Tube secured with: Tape Dental Injury: Teeth and Oropharynx as per pre-operative assessment  Comments: By Morrie Sheldon, SRNA

## 2023-03-27 ENCOUNTER — Encounter (HOSPITAL_COMMUNITY): Payer: Self-pay | Admitting: General Surgery

## 2023-06-11 IMAGING — MG MM DIGITAL SCREENING BILAT W/ TOMO AND CAD
8 series · 9 of 24 positions shown · non-contrast
Comparison: Previous exam(s).

CLINICAL DATA: Screening.

EXAM:
DIGITAL SCREENING BILATERAL MAMMOGRAM WITH TOMOSYNTHESIS AND CAD
TECHNIQUE: Bilateral screening digital craniocaudal and mediolateral oblique
mammograms were obtained. Bilateral screening digital breast
tomosynthesis was performed. The images were evaluated with
computer-aided detection.

[L CC synth-2D]
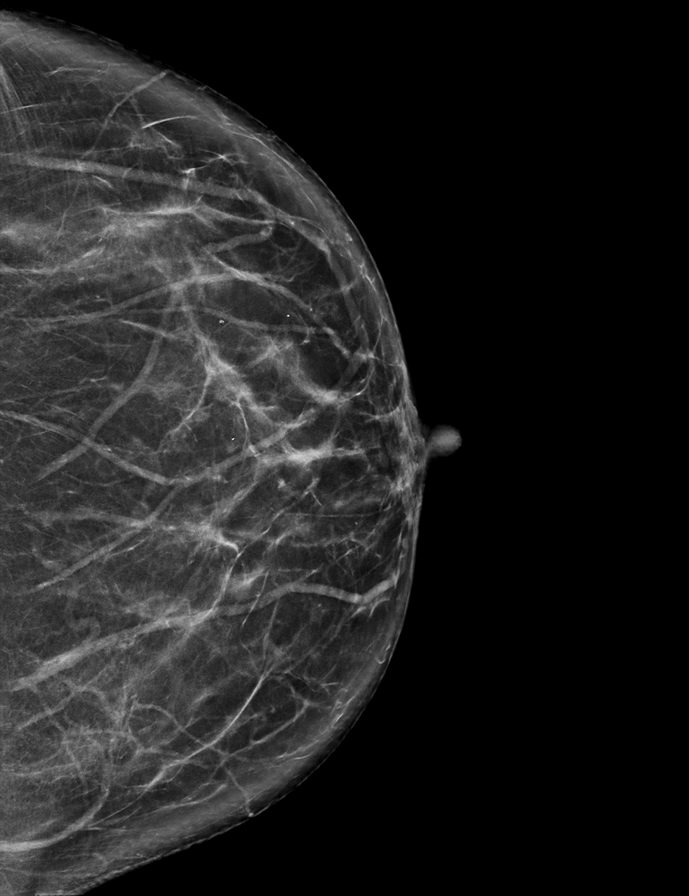

[R MLO synth-2D]
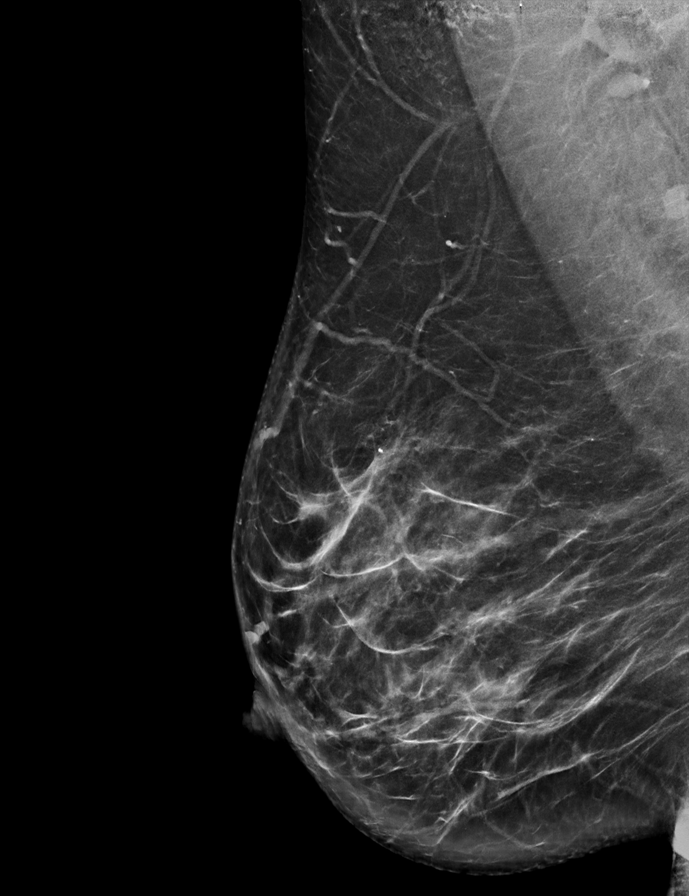

[L MLO synth-2D]
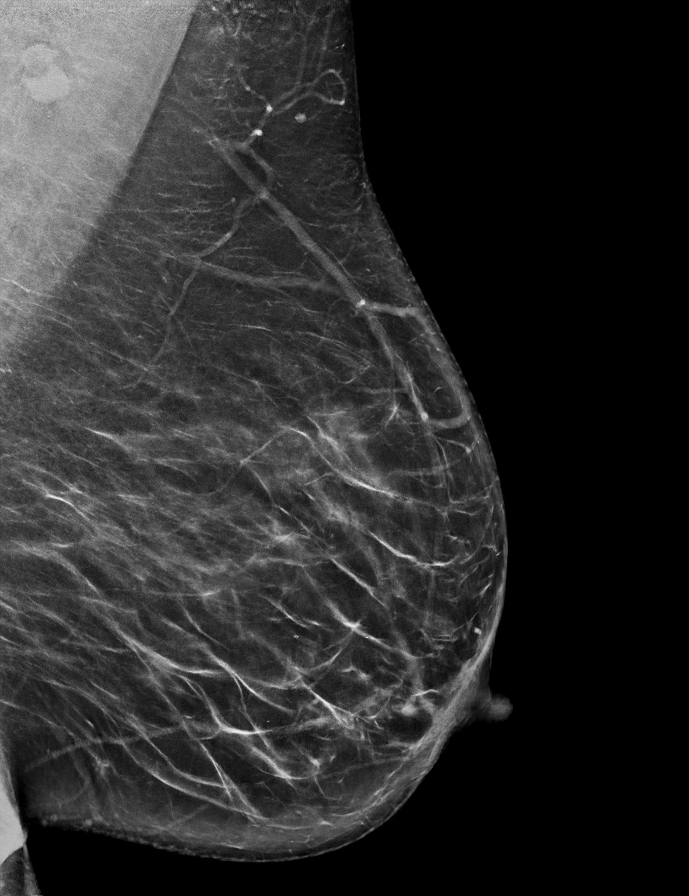

[R CC synth-2D]
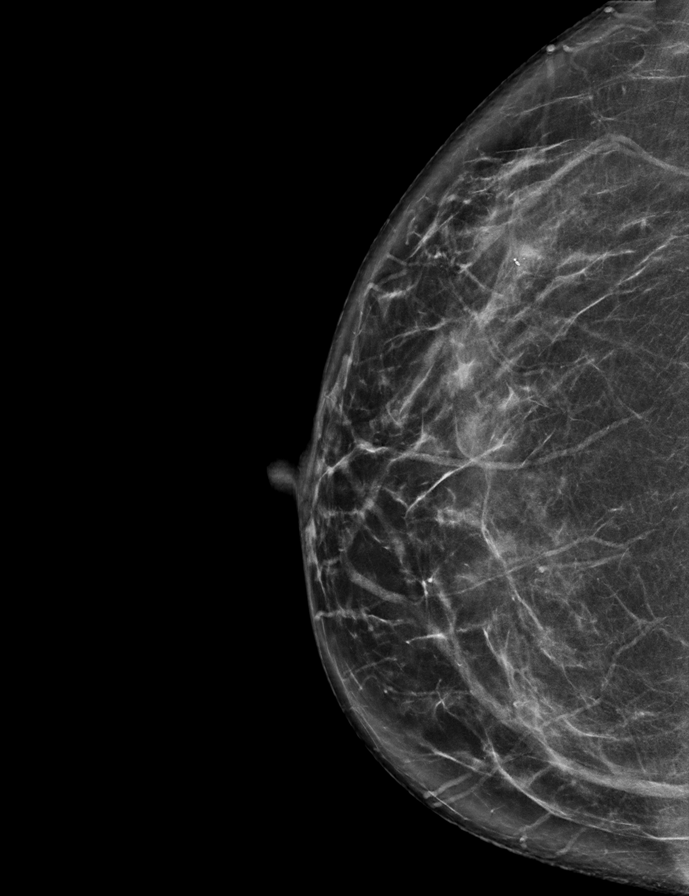

[L MLO tomo · 2 of 79 frames shown]
[frame 26/79]
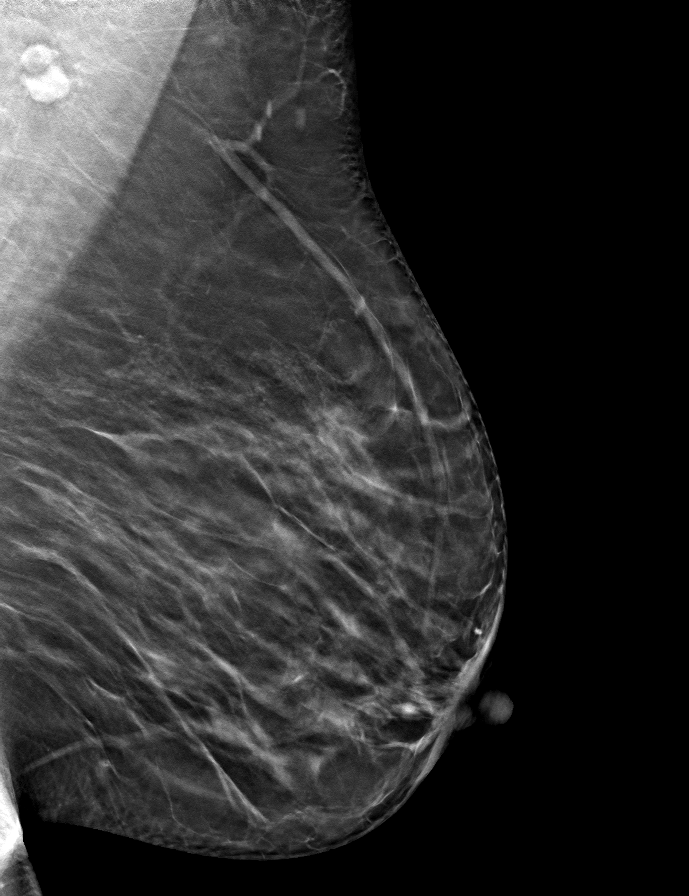
[frame 40/79]
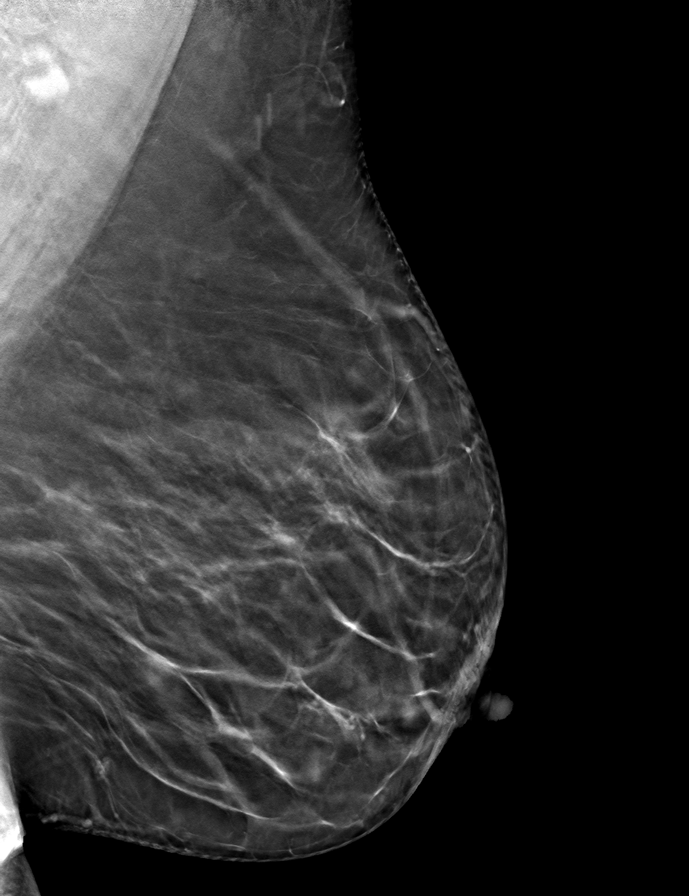

[R MLO tomo · tomo slice 41/81.0]
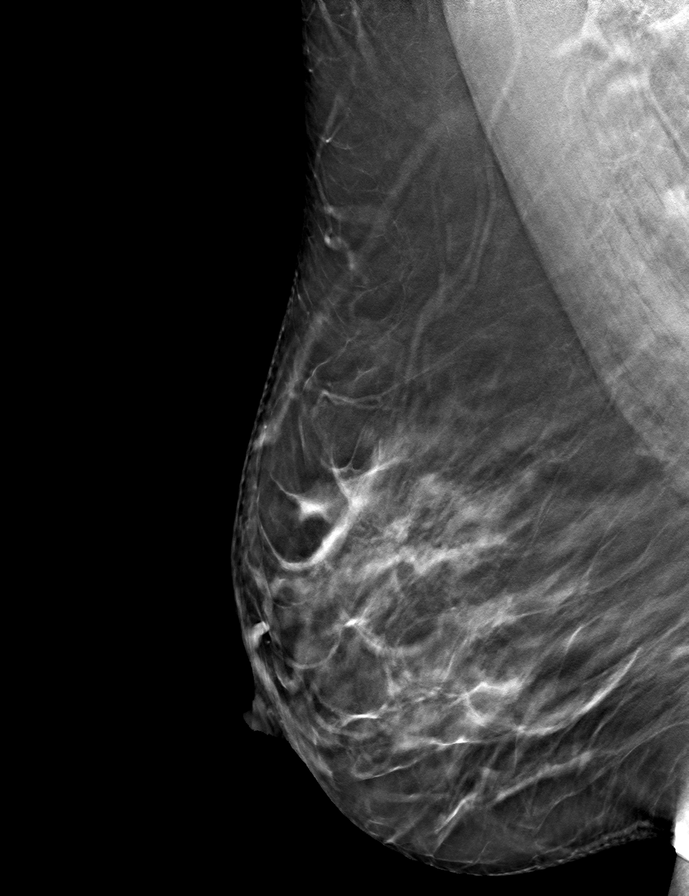

[R CC tomo · tomo slice 35/69.0]
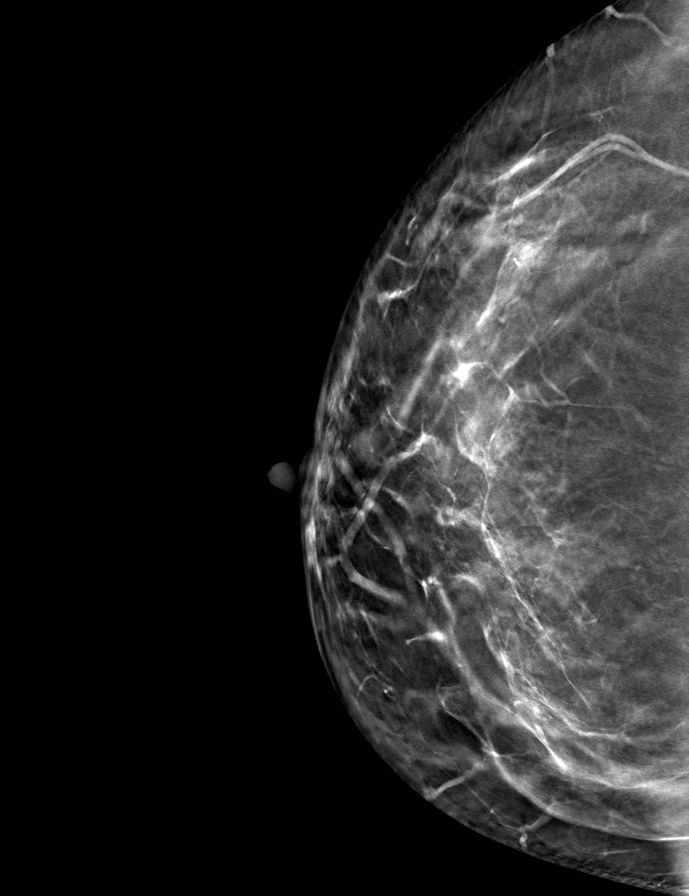

[L CC tomo · tomo slice 35/68.0]
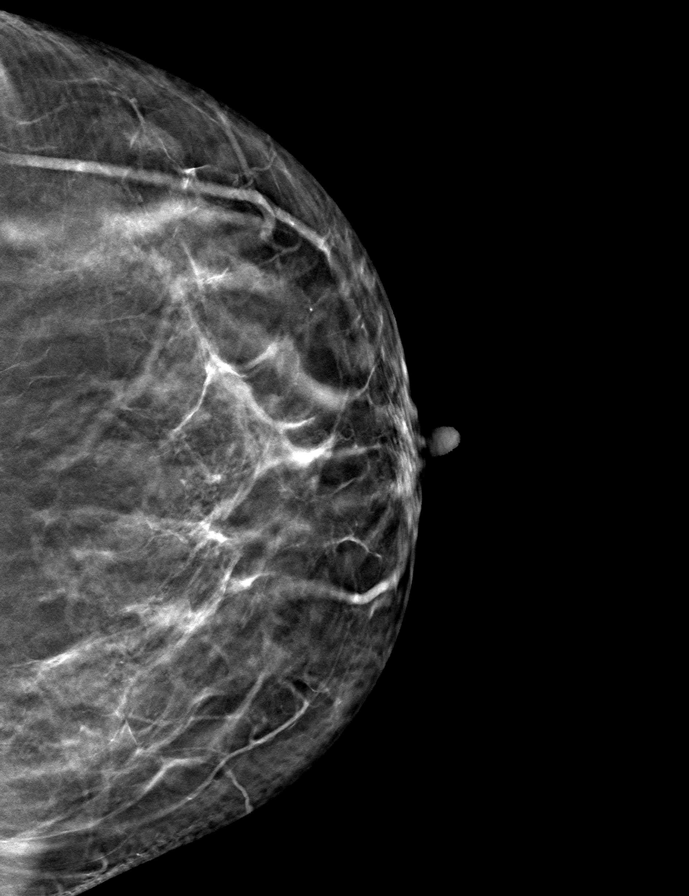

[9 of 24 positions shown; findings below may reference images not displayed]

ACR Breast Density Category b: There are scattered areas of
fibroglandular density.
FINDINGS: There are no findings suspicious for malignancy.
IMPRESSION: No mammographic evidence of malignancy. A result letter of this
screening mammogram will be mailed directly to the patient.

RECOMMENDATION:
Screening mammogram in one year. (Code:51-O-LD2)

BI-RADS CATEGORY  1: Negative.

## 2023-11-18 ENCOUNTER — Other Ambulatory Visit: Payer: Self-pay | Admitting: Obstetrics & Gynecology

## 2023-11-18 DIAGNOSIS — Z1231 Encounter for screening mammogram for malignant neoplasm of breast: Secondary | ICD-10-CM

## 2024-01-14 ENCOUNTER — Ambulatory Visit
Admission: RE | Admit: 2024-01-14 | Discharge: 2024-01-14 | Disposition: A | Discharge status: Home / Self Care | Source: Ambulatory Visit | Attending: Obstetrics & Gynecology | Admitting: Obstetrics & Gynecology

## 2024-01-14 DIAGNOSIS — Z1231 Encounter for screening mammogram for malignant neoplasm of breast: Secondary | ICD-10-CM
